# Patient Record
Sex: Male | Born: 1950 | Race: Black or African American | Hispanic: No | Marital: Married | Smoking: Never smoker
Health system: Southern US, Community
[De-identification: ages and names within clinical notes are randomized; demographics above are authoritative.]

## PROBLEM LIST (undated history)

## (undated) ENCOUNTER — Emergency Department (HOSPITAL_BASED_OUTPATIENT_CLINIC_OR_DEPARTMENT_OTHER): Payer: Self-pay | Source: Home / Self Care

## (undated) DIAGNOSIS — E785 Hyperlipidemia, unspecified: Secondary | ICD-10-CM

## (undated) DIAGNOSIS — I509 Heart failure, unspecified: Secondary | ICD-10-CM

## (undated) DIAGNOSIS — I712 Thoracic aortic aneurysm, without rupture, unspecified: Secondary | ICD-10-CM

## (undated) DIAGNOSIS — I2510 Atherosclerotic heart disease of native coronary artery without angina pectoris: Secondary | ICD-10-CM

## (undated) DIAGNOSIS — I440 Atrioventricular block, first degree: Secondary | ICD-10-CM

## (undated) DIAGNOSIS — I10 Essential (primary) hypertension: Secondary | ICD-10-CM

## (undated) DIAGNOSIS — N182 Chronic kidney disease, stage 2 (mild): Secondary | ICD-10-CM

## (undated) DIAGNOSIS — M109 Gout, unspecified: Secondary | ICD-10-CM

## (undated) DIAGNOSIS — G4733 Obstructive sleep apnea (adult) (pediatric): Secondary | ICD-10-CM

## (undated) DIAGNOSIS — I428 Other cardiomyopathies: Secondary | ICD-10-CM

## (undated) DIAGNOSIS — I4510 Unspecified right bundle-branch block: Secondary | ICD-10-CM

## (undated) DIAGNOSIS — N400 Enlarged prostate without lower urinary tract symptoms: Secondary | ICD-10-CM

## (undated) DIAGNOSIS — I371 Nonrheumatic pulmonary valve insufficiency: Secondary | ICD-10-CM

## (undated) DIAGNOSIS — I5020 Unspecified systolic (congestive) heart failure: Secondary | ICD-10-CM

## (undated) DIAGNOSIS — I5022 Chronic systolic (congestive) heart failure: Secondary | ICD-10-CM

## (undated) HISTORY — PX: CORONARY ANGIOPLASTY WITH STENT PLACEMENT: SHX49

---

## 2001-10-13 ENCOUNTER — Ambulatory Visit (HOSPITAL_BASED_OUTPATIENT_CLINIC_OR_DEPARTMENT_OTHER): Admission: RE | Admit: 2001-10-13 | Discharge: 2001-10-13 | Payer: Self-pay | Admitting: Urology

## 2002-03-23 ENCOUNTER — Observation Stay (HOSPITAL_COMMUNITY): Admission: RE | Admit: 2002-03-23 | Discharge: 2002-03-24 | Payer: Self-pay | Admitting: Urology

## 2017-02-28 ENCOUNTER — Emergency Department (HOSPITAL_BASED_OUTPATIENT_CLINIC_OR_DEPARTMENT_OTHER)
Admission: EM | Admit: 2017-02-28 | Discharge: 2017-03-01 | Disposition: A | Discharge status: Home / Self Care | Payer: 59 | Attending: Emergency Medicine | Admitting: Emergency Medicine

## 2017-02-28 ENCOUNTER — Encounter (HOSPITAL_BASED_OUTPATIENT_CLINIC_OR_DEPARTMENT_OTHER): Payer: Self-pay | Admitting: Emergency Medicine

## 2017-02-28 ENCOUNTER — Emergency Department (HOSPITAL_BASED_OUTPATIENT_CLINIC_OR_DEPARTMENT_OTHER): Payer: 59

## 2017-02-28 DIAGNOSIS — N320 Bladder-neck obstruction: Secondary | ICD-10-CM | POA: Diagnosis not present

## 2017-02-28 DIAGNOSIS — I110 Hypertensive heart disease with heart failure: Secondary | ICD-10-CM | POA: Diagnosis not present

## 2017-02-28 DIAGNOSIS — R1031 Right lower quadrant pain: Secondary | ICD-10-CM | POA: Diagnosis present

## 2017-02-28 DIAGNOSIS — N401 Enlarged prostate with lower urinary tract symptoms: Secondary | ICD-10-CM | POA: Insufficient documentation

## 2017-02-28 DIAGNOSIS — R319 Hematuria, unspecified: Secondary | ICD-10-CM | POA: Diagnosis not present

## 2017-02-28 DIAGNOSIS — I509 Heart failure, unspecified: Secondary | ICD-10-CM | POA: Insufficient documentation

## 2017-02-28 DIAGNOSIS — Z79899 Other long term (current) drug therapy: Secondary | ICD-10-CM | POA: Diagnosis not present

## 2017-02-28 DIAGNOSIS — N400 Enlarged prostate without lower urinary tract symptoms: Secondary | ICD-10-CM

## 2017-02-28 DIAGNOSIS — R39198 Other difficulties with micturition: Secondary | ICD-10-CM | POA: Diagnosis not present

## 2017-02-28 HISTORY — DX: Essential (primary) hypertension: I10

## 2017-02-28 HISTORY — DX: Gout, unspecified: M10.9

## 2017-02-28 HISTORY — DX: Heart failure, unspecified: I50.9

## 2017-02-28 LAB — URINALYSIS, ROUTINE W REFLEX MICROSCOPIC
BILIRUBIN URINE: NEGATIVE
Glucose, UA: NEGATIVE mg/dL
HGB URINE DIPSTICK: NEGATIVE
Ketones, ur: NEGATIVE mg/dL
Leukocytes, UA: NEGATIVE
NITRITE: NEGATIVE
PH: 5.5 (ref 5.0–8.0)
Protein, ur: NEGATIVE mg/dL
SPECIFIC GRAVITY, URINE: 1.019 (ref 1.005–1.030)

## 2017-02-28 LAB — CBC WITH DIFFERENTIAL/PLATELET
Basophils Absolute: 0 10*3/uL (ref 0.0–0.1)
Basophils Relative: 0 %
Eosinophils Absolute: 0.1 10*3/uL (ref 0.0–0.7)
Eosinophils Relative: 1 %
HCT: 44.4 % (ref 39.0–52.0)
HEMOGLOBIN: 15.9 g/dL (ref 13.0–17.0)
LYMPHS ABS: 1.2 10*3/uL (ref 0.7–4.0)
LYMPHS PCT: 22 %
MCH: 28.8 pg (ref 26.0–34.0)
MCHC: 35.8 g/dL (ref 30.0–36.0)
MCV: 80.4 fL (ref 78.0–100.0)
MONOS PCT: 13 %
Monocytes Absolute: 0.7 10*3/uL (ref 0.1–1.0)
NEUTROS PCT: 64 %
Neutro Abs: 3.3 10*3/uL (ref 1.7–7.7)
Platelets: 167 10*3/uL (ref 150–400)
RBC: 5.52 MIL/uL (ref 4.22–5.81)
RDW: 16.6 % — ABNORMAL HIGH (ref 11.5–15.5)
WBC: 5.2 10*3/uL (ref 4.0–10.5)

## 2017-02-28 LAB — BASIC METABOLIC PANEL
ANION GAP: 9 (ref 5–15)
BUN: 28 mg/dL — ABNORMAL HIGH (ref 6–20)
CHLORIDE: 102 mmol/L (ref 101–111)
CO2: 23 mmol/L (ref 22–32)
Calcium: 9 mg/dL (ref 8.9–10.3)
Creatinine, Ser: 1.47 mg/dL — ABNORMAL HIGH (ref 0.61–1.24)
GFR calc non Af Amer: 49 mL/min — ABNORMAL LOW (ref >60)
GFR, EST AFRICAN AMERICAN: 56 mL/min — AB (ref >60)
Glucose, Bld: 103 mg/dL — ABNORMAL HIGH (ref 65–99)
POTASSIUM: 4.5 mmol/L (ref 3.5–5.1)
Sodium: 134 mmol/L — ABNORMAL LOW (ref 135–145)

## 2017-02-28 MED ORDER — MORPHINE SULFATE (PF) 4 MG/ML IV SOLN
4.0000 mg | Freq: Once | INTRAVENOUS | Status: AC
  Administered 2017-02-28: 4 mg via INTRAVENOUS
  Filled 2017-02-28: qty 1

## 2017-02-28 MED ORDER — KETOROLAC TROMETHAMINE 30 MG/ML IJ SOLN
15.0000 mg | Freq: Once | INTRAMUSCULAR | Status: AC
  Administered 2017-02-28: 15 mg via INTRAVENOUS
  Filled 2017-02-28: qty 1

## 2017-02-28 MED ORDER — SODIUM CHLORIDE 0.9 % IV BOLUS (SEPSIS)
1000.0000 mL | Freq: Once | INTRAVENOUS | Status: AC
  Administered 2017-02-28: 1000 mL via INTRAVENOUS

## 2017-02-28 NOTE — ED Provider Notes (Signed)
[facility] Provider Note   CSN: [id number] Arrival date & time: 02/28/17  2128  By signing my name below, I, Brandon Cuevas, attest that this documentation has been prepared under the direction and in the presence of [name], Barbara Cower, MD. Electronically Signed: [name], ED Scribe. 02/28/17. 10:20 PM.  History   Chief Complaint Chief Complaint  Patient presents with  . Flank Pain    HPI Brandon Cuevas is a 66 y.o. male with a PMHx of CHF, gout and HTN, who presents to the Emergency Department complaining of gradually worsening, right flank pain for the last 2 days. Pt reports he is trying to pass a kidney stone. His pain radiates to his back and groin. Associated sx include decreased urinary output, urinary odor, and diaphoresis. Pt has been taking aspirin without relief. He had a kidney stone ~ 10-15 years. He had to have it surgically removed. Notes pain is the same. Pt denies hematuria, nausea, vomiting, or any other complaints at this time.   The history is provided by the patient. No language interpreter was used.    Past Medical History:  Diagnosis Date  . CHF (congestive heart failure) (HCC)   . Gout   . Hypertension     There are no active problems to display for this patient.   Past Surgical History:  Procedure Laterality Date  . CORONARY ANGIOPLASTY WITH STENT PLACEMENT         Home Medications    Prior to Admission medications   Medication Sig Start Date End Date Taking? Authorizing Provider  allopurinol (ZYLOPRIM) 300 MG tablet Take 300 mg by mouth daily.   Yes [provider]  carvedilol (COREG) 25 MG tablet Take 25 mg by mouth 2 (two) times daily with a meal.   Yes [provider]  Colchicine (MITIGARE) 0.6 MG CAPS Take by mouth.   Yes [provider]  furosemide (LASIX) 20 MG tablet Take 20 mg by mouth.   Yes [provider]  losartan (COZAAR) 100 MG tablet Take 100 mg by mouth daily.   Yes [provider]  potassium chloride SA (K-DUR,KLOR-CON) 20 MEQ tablet Take 20 mEq by mouth 2 (two) times daily.   Yes [provider]    Family History History reviewed. No pertinent family history.  Social History Social History  Substance Use Topics  . Smoking status: Never Smoker  . Smokeless tobacco: Never Used  . Alcohol use No     Allergies   Sulfa antibiotics   Review of Systems Review of Systems  All other systems reviewed and are negative.  All systems are negative except as noted in the HPI and PMH.   Physical Exam Updated Vital Signs BP (!) 164/116 (BP Location: Left Arm)   Pulse 91   Temp 97.9 F (36.6 C) (Oral)   Resp 19   Ht 5\' 9"  (1.753 m)   Wt 82.1 kg (181 lb)   SpO2 100%   BMI 26.73 kg/m   Physical Exam  Constitutional: He is oriented to person, place, and time. He appears well-developed and well-nourished.  HENT:  Head: Normocephalic.  Eyes: EOM are normal.  Neck: Normal range of motion.  Right sided CVA tenderness.  Cardiovascular: Normal rate, regular rhythm and normal heart sounds.   Pulmonary/Chest: Effort normal.  Abdominal: Soft. He exhibits no distension. There is no tenderness.  Musculoskeletal: Normal range of motion.  Neurological: He is alert and oriented to person, place, and time.  Skin: No rash  noted.  Psychiatric: He has a normal mood and affect.  Nursing note and vitals reviewed.    ED Treatments / Results  DIAGNOSTIC STUDIES: Oxygen Saturation is 100% on RA, normal by my interpretation.   COORDINATION OF CARE: 10:20 PM-Discussed next steps with pt. Pt verbalized understanding and is agreeable with the plan.   Labs (all labs ordered are listed, but only abnormal results are displayed) Labs Reviewed  CBC WITH DIFFERENTIAL/PLATELET - Abnormal; Notable for the following:       Result Value   RDW 16.6 (*)    All other components within normal limits  BASIC METABOLIC PANEL - Abnormal; Notable for the  following:    Sodium 134 (*)    Glucose, Bld 103 (*)    BUN 28 (*)    Creatinine, Ser 1.47 (*)    GFR calc non Af Amer 49 (*)    GFR calc Af Amer 56 (*)    All other components within normal limits  URINALYSIS, ROUTINE W REFLEX MICROSCOPIC    EKG  EKG Interpretation None       Radiology Ct Renal Stone Study  Result Date: 02/28/2017 CLINICAL DATA:  Right flank pain and difficulty urinating. Hematuria. EXAM: CT ABDOMEN AND PELVIS WITHOUT CONTRAST TECHNIQUE: Multidetector CT imaging of the abdomen and pelvis was performed following the standard protocol without IV contrast. COMPARISON:  Abdominal ultrasound 06/11/2013 FINDINGS: Lower chest: Enlarged heart. Calcific atherosclerotic disease of the coronary arteries. Hepatobiliary: No focal liver abnormality is seen. No gallstones, gallbladder wall thickening, or biliary dilatation. Pancreas: Unremarkable. No pancreatic ductal dilatation or surrounding inflammatory changes. Spleen: Normal in size without focal abnormality. Adrenals/Urinary Tract: Normal adrenal glands and right kidney. Hypoattenuated 1.4 cm mass in the anterior cortex of the lower pole of the right kidney likely represents a cyst. No evidence of hydronephrosis or hydroureter. Stomach/Bowel: Stomach is within normal limits. Appendix appears normal. No evidence of bowel wall thickening, distention, or inflammatory changes. Vascular/Lymphatic: Aortic atherosclerosis. No enlarged abdominal or pelvic lymph nodes. Reproductive: Enlarged globular prostate measuring 6.2 x 5.8 Cm. Penile prosthesis with reservoir in the right pelvis. The urinary bladder is markedly distended with superior tip nearing the level of the umbilicus. Other: Small fat containing periumbilical anterior abdominal wall hernia. Musculoskeletal: L5-S1 osteoarthritic changes. IMPRESSION: Probable urinary bladder outlet obstruction with marked distension of the urinary bladder and enlarged globular appearance of the  prostate gland. Please correlate to serum PSA values. Penile prosthesis. No evidence of hydronephrosis, hydroureter or nephrolithiasis. Calcific atherosclerotic disease of the aorta. Enlarged heart with calcific atherosclerotic disease of the coronary arteries. Electronically Signed   By: Ted Mcalpine M.D.   On: 02/28/2017 23:02    Procedures Procedures (including critical care time)  Medications Ordered in ED Medications  sodium chloride 0.9 % bolus 1,000 mL (1,000 mLs Intravenous New Bag/Given 02/28/17 2257)  ketorolac (TORADOL) 30 MG/ML injection 15 mg (15 mg Intravenous Given 02/28/17 2301)  morphine 4 MG/ML injection 4 mg (4 mg Intravenous Given 02/28/17 2355)     Initial Impression / Assessment and Plan / ED Course  I have reviewed the triage vital signs and the nursing notes.  Pertinent labs & imaging results that were available during my care of the patient were reviewed by me and considered in my medical decision making (see chart for details).   Suspect patient's symptoms are related to urinary outlet obstruction from prostatomegaly. Unsure if he has BPH or cancer will follow-up with urology to figure out the same. We'll go  home with Foley. No evidence of significant kidney dysfunction hours creatinine is slightly elevated so we'll need to get that rechecked as well.  Final Clinical Impressions(s) / ED Diagnoses   Final diagnoses:  Bladder outlet obstruction  Enlarged prostate    New Prescriptions New Prescriptions   No medications on file   I personally performed the services described in this documentation, which was scribed in my presence. The recorded information has been reviewed and is accurate.     Azhar Knope, Barbara Cower, MD 03/01/17 [phone]

## 2017-02-28 NOTE — ED Triage Notes (Signed)
Patient states that he is trying to pass a kidney stone. Report that he is having pain to his right back and down into his groin

## 2017-03-01 NOTE — ED Notes (Signed)
Catheter care instructions provided to the pt and the pt verbalized understanding and provided teach back.

## 2017-03-05 ENCOUNTER — Emergency Department (HOSPITAL_BASED_OUTPATIENT_CLINIC_OR_DEPARTMENT_OTHER)
Admission: EM | Admit: 2017-03-05 | Discharge: 2017-03-05 | Disposition: A | Discharge status: Home / Self Care | Payer: 59 | Attending: Emergency Medicine | Admitting: Emergency Medicine

## 2017-03-05 ENCOUNTER — Encounter (HOSPITAL_BASED_OUTPATIENT_CLINIC_OR_DEPARTMENT_OTHER): Payer: Self-pay | Admitting: Emergency Medicine

## 2017-03-05 DIAGNOSIS — Z79899 Other long term (current) drug therapy: Secondary | ICD-10-CM | POA: Diagnosis not present

## 2017-03-05 DIAGNOSIS — Z955 Presence of coronary angioplasty implant and graft: Secondary | ICD-10-CM | POA: Diagnosis not present

## 2017-03-05 DIAGNOSIS — I509 Heart failure, unspecified: Secondary | ICD-10-CM | POA: Insufficient documentation

## 2017-03-05 DIAGNOSIS — T83031A Leakage of indwelling urethral catheter, initial encounter: Secondary | ICD-10-CM | POA: Diagnosis not present

## 2017-03-05 DIAGNOSIS — Y829 Unspecified medical devices associated with adverse incidents: Secondary | ICD-10-CM | POA: Insufficient documentation

## 2017-03-05 DIAGNOSIS — T839XXA Unspecified complication of genitourinary prosthetic device, implant and graft, initial encounter: Secondary | ICD-10-CM

## 2017-03-05 DIAGNOSIS — I10 Essential (primary) hypertension: Secondary | ICD-10-CM | POA: Insufficient documentation

## 2017-03-05 HISTORY — DX: Benign prostatic hyperplasia without lower urinary tract symptoms: N40.0

## 2017-03-05 LAB — BASIC METABOLIC PANEL
Anion gap: 10 (ref 5–15)
BUN: 20 mg/dL (ref 6–20)
CHLORIDE: 104 mmol/L (ref 101–111)
CO2: 19 mmol/L — AB (ref 22–32)
Calcium: 9.2 mg/dL (ref 8.9–10.3)
Creatinine, Ser: 1.25 mg/dL — ABNORMAL HIGH (ref 0.61–1.24)
GFR calc Af Amer: >60 mL/min (ref >60)
GFR, EST NON AFRICAN AMERICAN: 59 mL/min — AB (ref >60)
GLUCOSE: 112 mg/dL — AB (ref 65–99)
Potassium: 4.2 mmol/L (ref 3.5–5.1)
SODIUM: 133 mmol/L — AB (ref 135–145)

## 2017-03-05 LAB — URINALYSIS, ROUTINE W REFLEX MICROSCOPIC
Bilirubin Urine: NEGATIVE
GLUCOSE, UA: NEGATIVE mg/dL
Ketones, ur: NEGATIVE mg/dL
Nitrite: NEGATIVE
PH: 5.5 (ref 5.0–8.0)
PROTEIN: NEGATIVE mg/dL
Specific Gravity, Urine: 1.007 (ref 1.005–1.030)

## 2017-03-05 LAB — URINALYSIS, MICROSCOPIC (REFLEX)

## 2017-03-05 MED ORDER — TRAMADOL HCL 50 MG PO TABS
50.0000 mg | ORAL_TABLET | Freq: Once | ORAL | Status: AC
  Administered 2017-03-05: 50 mg via ORAL
  Filled 2017-03-05: qty 1

## 2017-03-05 MED ORDER — TAMSULOSIN HCL 0.4 MG PO CAPS
0.4000 mg | ORAL_CAPSULE | Freq: Every day | ORAL | Status: DC
  Administered 2017-03-05: 0.4 mg via ORAL
  Filled 2017-03-05: qty 1

## 2017-03-05 NOTE — ED Triage Notes (Signed)
Pt has urinary catheter due to enlarged prostate. Pt states he has been urinating around catheter. No urine is going into catheter bag.

## 2017-03-05 NOTE — ED Provider Notes (Signed)
[facility]   CSN: [id number] Arrival date & time: 03/05/17  2706     History   Chief Complaint Chief Complaint  Patient presents with  . Urine Output    HPI Brandon Cuevas is a 66 y.o. male.  The history is provided by the patient.  Illness  This is a recurrent problem. The current episode started more than 2 days ago. The problem occurs constantly. The problem has not changed since onset.Pertinent negatives include no chest pain, no abdominal pain and no shortness of breath. Nothing aggravates the symptoms. Nothing relieves the symptoms. He has tried nothing for the symptoms. The treatment provided no relief.  Urinating around the foley catheter and no real drainage in the bag since Wednesday then started having spasm and is here for same.    Past Medical History:  Diagnosis Date  . CHF (congestive heart failure) (HCC)   . Enlarged prostate   . Gout   . Hypertension     There are no active problems to display for this patient.   Past Surgical History:  Procedure Laterality Date  . CORONARY ANGIOPLASTY WITH STENT PLACEMENT         Home Medications    Prior to Admission medications   Medication Sig Start Date End Date Taking? Authorizing Provider  allopurinol (ZYLOPRIM) 300 MG tablet Take 300 mg by mouth daily.    [provider]  carvedilol (COREG) 25 MG tablet Take 25 mg by mouth 2 (two) times daily with a meal.    [provider]  Colchicine (MITIGARE) 0.6 MG CAPS Take by mouth.    [provider]  furosemide (LASIX) 20 MG tablet Take 20 mg by mouth.    [provider]  losartan (COZAAR) 100 MG tablet Take 100 mg by mouth daily.    [provider]  potassium chloride SA (K-DUR,KLOR-CON) 20 MEQ tablet Take 20 mEq by mouth 2 (two) times daily.    [provider]    Family History No family history on file.  Social History Social History  Substance Use Topics  . Smoking status:  Never Smoker  . Smokeless tobacco: Never Used  . Alcohol use No     Allergies   Sulfa antibiotics   Review of Systems Review of Systems  Constitutional: Negative for fever.  Respiratory: Negative for shortness of breath.   Cardiovascular: Negative for chest pain.  Gastrointestinal: Negative for abdominal pain.  Genitourinary: Positive for difficulty urinating.  All other systems reviewed and are negative.    Physical Exam Updated Vital Signs BP (!) 149/104   Pulse 82   Temp 97.9 F (36.6 C) (Oral)   Resp 18   SpO2 100%   Physical Exam  Constitutional: He is oriented to person, place, and time. He appears well-developed and well-nourished. No distress.  HENT:  Head: Normocephalic and atraumatic.  Nose: Nose normal.  Mouth/Throat: No oropharyngeal exudate.  Eyes: Pupils are equal, round, and reactive to light. EOM are normal.  Neck: Normal range of motion.  Cardiovascular: Normal rate, regular rhythm, normal heart sounds and intact distal pulses.   Pulmonary/Chest: Effort normal and breath sounds normal. He has no wheezes.  Abdominal: Soft. Bowel sounds are normal. He exhibits no mass. There is no tenderness. There is no rebound and no guarding.  Musculoskeletal: Normal range of motion.  Neurological: He is alert and oriented to person, place, and time.  Skin: Skin is warm and dry. Capillary refill takes less than 2  seconds.  Psychiatric: He has a normal mood and affect.     ED Treatments / Results   Vitals:   03/05/17 0325  BP: (!) 149/104  Pulse: 82  Resp: 18  Temp: 97.9 F (36.6 C)  SpO2: 100%    Labs (all labs ordered are listed, but only abnormal results are displayed) Labs Reviewed  BASIC METABOLIC PANEL - Abnormal; Notable for the following:       Result Value   Sodium 133 (*)    CO2 19 (*)    Glucose, Bld 112 (*)    Creatinine, Ser 1.25 (*)    GFR calc non Af Amer 59 (*)    All other components within normal limits  URINALYSIS, ROUTINE W  REFLEX MICROSCOPIC - Abnormal; Notable for the following:    APPearance CLOUDY (*)    Hgb urine dipstick MODERATE (*)    Leukocytes, UA SMALL (*)    All other components within normal limits  URINALYSIS, MICROSCOPIC (REFLEX) - Abnormal; Notable for the following:    Bacteria, UA FEW (*)    Squamous Epithelial / LPF 0-5 (*)    All other components within normal limits     Procedures Procedures (including critical care time)  Medications Ordered in ED Medications  tamsulosin (FLOMAX) capsule 0.4 mg (0.4 mg Oral Given 03/05/17 0401)  traMADol (ULTRAM) tablet 50 mg (50 mg Oral Given 03/05/17 0400)     Foley replaced and draining well.   Final Clinical Impressions(s) / ED Diagnoses  Continue flomax keep your appointment with your urologist on Monday.     The patient is very well appearing and has been observed in the ED.  Strict return precautions given for  intractable rash, swelling or the lips tongue or floor of the mouth, chest pain, dyspnea on exertion, new weakness or numbness changes in vision or speech,  Inability to tolerate liquids or food, fevers > 101, rashes on the skin, changes in voice cough, altered mental status or any concerns. No signs of systemic illness or infection. The patient is nontoxic-appearing on exam and vital signs are within normal limits.    I have reviewed the triage vital signs and the nursing notes. Pertinent labs &imaging results that were available during my care of the patient were reviewed by me and considered in my medical decision making (see chart for details).  After history, exam, and medical workup I feel the patient has been appropriately medically screened and is safe for discharge home. Pertinent diagnoses were discussed with the patient. Patient was given return precautions.     Sulaiman Imbert, MD 03/05/17 [phone]

## 2017-03-20 ENCOUNTER — Encounter (HOSPITAL_BASED_OUTPATIENT_CLINIC_OR_DEPARTMENT_OTHER): Payer: Self-pay | Admitting: Emergency Medicine

## 2017-03-20 ENCOUNTER — Emergency Department (HOSPITAL_BASED_OUTPATIENT_CLINIC_OR_DEPARTMENT_OTHER): Payer: 59

## 2017-03-20 ENCOUNTER — Observation Stay (HOSPITAL_BASED_OUTPATIENT_CLINIC_OR_DEPARTMENT_OTHER)
Admission: EM | Admit: 2017-03-20 | Discharge: 2017-03-23 | DRG: 292 | Disposition: A | Discharge status: Home / Self Care | Payer: 59 | Attending: Internal Medicine | Admitting: Internal Medicine

## 2017-03-20 DIAGNOSIS — Z7982 Long term (current) use of aspirin: Secondary | ICD-10-CM | POA: Diagnosis not present

## 2017-03-20 DIAGNOSIS — Z955 Presence of coronary angioplasty implant and graft: Secondary | ICD-10-CM

## 2017-03-20 DIAGNOSIS — I509 Heart failure, unspecified: Secondary | ICD-10-CM

## 2017-03-20 DIAGNOSIS — I110 Hypertensive heart disease with heart failure: Principal | ICD-10-CM | POA: Diagnosis present

## 2017-03-20 DIAGNOSIS — R778 Other specified abnormalities of plasma proteins: Secondary | ICD-10-CM | POA: Diagnosis present

## 2017-03-20 DIAGNOSIS — R0602 Shortness of breath: Secondary | ICD-10-CM | POA: Diagnosis present

## 2017-03-20 DIAGNOSIS — I10 Essential (primary) hypertension: Secondary | ICD-10-CM | POA: Diagnosis present

## 2017-03-20 DIAGNOSIS — R079 Chest pain, unspecified: Secondary | ICD-10-CM | POA: Diagnosis present

## 2017-03-20 DIAGNOSIS — I5023 Acute on chronic systolic (congestive) heart failure: Secondary | ICD-10-CM | POA: Diagnosis not present

## 2017-03-20 DIAGNOSIS — R7989 Other specified abnormal findings of blood chemistry: Secondary | ICD-10-CM

## 2017-03-20 DIAGNOSIS — I712 Thoracic aortic aneurysm, without rupture: Secondary | ICD-10-CM | POA: Diagnosis present

## 2017-03-20 DIAGNOSIS — M109 Gout, unspecified: Secondary | ICD-10-CM | POA: Diagnosis present

## 2017-03-20 DIAGNOSIS — Z79899 Other long term (current) drug therapy: Secondary | ICD-10-CM | POA: Diagnosis not present

## 2017-03-20 DIAGNOSIS — I2510 Atherosclerotic heart disease of native coronary artery without angina pectoris: Secondary | ICD-10-CM | POA: Diagnosis not present

## 2017-03-20 DIAGNOSIS — I7121 Aneurysm of the ascending aorta, without rupture: Secondary | ICD-10-CM | POA: Diagnosis present

## 2017-03-20 DIAGNOSIS — Z8249 Family history of ischemic heart disease and other diseases of the circulatory system: Secondary | ICD-10-CM

## 2017-03-20 DIAGNOSIS — I248 Other forms of acute ischemic heart disease: Secondary | ICD-10-CM | POA: Diagnosis present

## 2017-03-20 HISTORY — DX: Unspecified systolic (congestive) heart failure: I50.20

## 2017-03-20 LAB — CBC WITH DIFFERENTIAL/PLATELET
BASOS PCT: 0 %
Basophils Absolute: 0 10*3/uL (ref 0.0–0.1)
EOS PCT: 1 %
Eosinophils Absolute: 0 10*3/uL (ref 0.0–0.7)
HCT: 42.5 % (ref 39.0–52.0)
HEMOGLOBIN: 14.8 g/dL (ref 13.0–17.0)
Lymphocytes Relative: 18 %
Lymphs Abs: 0.8 10*3/uL (ref 0.7–4.0)
MCH: 28.9 pg (ref 26.0–34.0)
MCHC: 34.8 g/dL (ref 30.0–36.0)
MCV: 83 fL (ref 78.0–100.0)
MONO ABS: 0.6 10*3/uL (ref 0.1–1.0)
MONOS PCT: 13 %
NEUTROS PCT: 68 %
Neutro Abs: 3.2 10*3/uL (ref 1.7–7.7)
Platelets: 165 10*3/uL (ref 150–400)
RBC: 5.12 MIL/uL (ref 4.22–5.81)
RDW: 16.7 % — AB (ref 11.5–15.5)
WBC: 4.6 10*3/uL (ref 4.0–10.5)

## 2017-03-20 LAB — COMPREHENSIVE METABOLIC PANEL
ALBUMIN: 3.5 g/dL (ref 3.5–5.0)
ALK PHOS: 39 U/L (ref 38–126)
ALT: 67 U/L — ABNORMAL HIGH (ref 17–63)
ANION GAP: 7 (ref 5–15)
AST: 39 U/L (ref 15–41)
BUN: 23 mg/dL — ABNORMAL HIGH (ref 6–20)
CALCIUM: 9 mg/dL (ref 8.9–10.3)
CHLORIDE: 109 mmol/L (ref 101–111)
CO2: 22 mmol/L (ref 22–32)
Creatinine, Ser: 1.14 mg/dL (ref 0.61–1.24)
GFR calc non Af Amer: >60 mL/min (ref >60)
GLUCOSE: 116 mg/dL — AB (ref 65–99)
POTASSIUM: 4.3 mmol/L (ref 3.5–5.1)
Sodium: 138 mmol/L (ref 135–145)
Total Bilirubin: 1.2 mg/dL (ref 0.3–1.2)
Total Protein: 6.3 g/dL — ABNORMAL LOW (ref 6.5–8.1)

## 2017-03-20 LAB — BRAIN NATRIURETIC PEPTIDE: B NATRIURETIC PEPTIDE 5: 2834.9 pg/mL — AB (ref 0.0–100.0)

## 2017-03-20 LAB — D-DIMER, QUANTITATIVE: D-Dimer, Quant: 0.67 ug/mL-FEU — ABNORMAL HIGH (ref 0.00–0.50)

## 2017-03-20 LAB — TROPONIN I: TROPONIN I: 0.04 ng/mL — AB (ref <0.03)

## 2017-03-20 MED ORDER — IOPAMIDOL (ISOVUE-370) INJECTION 76%
100.0000 mL | Freq: Once | INTRAVENOUS | Status: AC | PRN
  Administered 2017-03-20: 100 mL via INTRAVENOUS

## 2017-03-20 MED ORDER — CARVEDILOL 25 MG PO TABS
25.0000 mg | ORAL_TABLET | Freq: Two times a day (BID) | ORAL | Status: DC
  Filled 2017-03-20: qty 1

## 2017-03-20 MED ORDER — FUROSEMIDE 10 MG/ML IJ SOLN
40.0000 mg | Freq: Once | INTRAMUSCULAR | Status: AC
  Administered 2017-03-20: 40 mg via INTRAVENOUS
  Filled 2017-03-20: qty 4

## 2017-03-20 MED ORDER — HEPARIN (PORCINE) IN NACL 100-0.45 UNIT/ML-% IJ SOLN
1000.0000 [IU]/h | INTRAMUSCULAR | Status: DC
  Administered 2017-03-20: 1000 [IU]/h via INTRAVENOUS
  Filled 2017-03-20: qty 250

## 2017-03-20 MED ORDER — HEPARIN BOLUS VIA INFUSION
4000.0000 [IU] | Freq: Once | INTRAVENOUS | Status: AC
  Administered 2017-03-20: 4000 [IU] via INTRAVENOUS

## 2017-03-20 NOTE — Progress Notes (Signed)
ANTICOAGULATION CONSULT NOTE - Initial Consult  Pharmacy Consult for heparin Indication: chest pain/ACS  Allergies  Allergen Reactions  . Sulfa Antibiotics Rash    Patient Measurements: Height: 5\' 9"  (175.3 cm) Weight: 185 lb (83.9 kg) IBW/kg (Calculated) : 70.7 Heparin Dosing Weight: 83.9kg  Vital Signs: Temp: 97.6 F (36.4 C) (09/08 1513) Temp Source: Oral (09/08 1513) BP: 139/107 (09/08 1601) Pulse Rate: 76 (09/08 1601)  Labs:  Recent Labs  03/20/17 1349  HGB 14.8  HCT 42.5  PLT 165  CREATININE 1.14  TROPONINI 0.04*    Estimated Creatinine Clearance: 65.5 mL/min (by C-G formula based on SCr of 1.14 mg/dL).   Medical History: Past Medical History:  Diagnosis Date  . CHF (congestive heart failure) (HCC)   . Enlarged prostate   . Gout   . Hypertension     Medications:  Infusions:  . heparin      Assessment: 65 yom presented to the ED with SOB. Troponin elevated and now starting IV heparin. CT of chest negative for PE. Baseline CBC is WNL and he is not on anticoagulation PTA.   Goal of Therapy:  Heparin level 0.3-0.7 units/ml Monitor platelets by anticoagulation protocol: Yes   Plan:  Heparin bolus 4000 units IV x 1 Heparin gtt 1000 units/hr Check a 6 hr heparin level Daily heparin level and CBC  Aubrii Sharpless, Drake Leachachel Lynn 03/20/2017,4:14 PM

## 2017-03-20 NOTE — ED Notes (Signed)
CT was notified that MD would like pt to get CTA.  They will come get him.  Pt denies any CP and is in no distress at this time, placed on monitor and cycling BP.

## 2017-03-20 NOTE — ED Notes (Signed)
Troponin 0.04, results given to ED MD

## 2017-03-20 NOTE — ED Triage Notes (Signed)
[facility] . Had CXR and EKG , denies chest pain

## 2017-03-20 NOTE — ED Provider Notes (Signed)
Patient accepted signout from Dr. Corlis LeakMacKuen. Patient had been evaluated for exertional dyspnea and chest pain. Dr. Corlis LeakMacKuen had completed diagnostic evaluation and treatment plan. Pulmonary embolus was ruled out by CT. Findings compatible with CHF with increased BNP and clinical history. Patient had reported some exertional chest pain when very dyspneic and troponin returned borderline elevated at 0.04. Dr.MacKuen opted to initiate ACS heparin protocol as well as Lasix for initial treatment plan. I did receive the call back from hospitalist regarding the patient's admission. Hospitalist requested I proceed with cardiology consultation to determine if it was appropriate to discontinue the patient's heparin as he was not having any active chest pain and this appear more consistent with CHF and less so with acute ischemic event. I did reevaluate patient. He reports that his symptom is exertional dyspnea. He reports he has to really exert himself and get very short of breath before he starts feeling some chest discomfort. He reports as soon as he rests the chest discomfort is gone. He denies any chest pain at this time. He is alert and appropriate. He is having no respiratory distress. I have consulted cardiology reviewed the case with Dr. Virgina OrganQureshi. He advises it would be appropriate to discontinue the heparin as history and findings are most consistent with congestive heart failure and not acute ischemia. Heparin will be discontinued and we'll proceed with admission plan.   Arby BarrettePfeiffer, Jadyn Brasher, MD 03/20/17 [phone]

## 2017-03-20 NOTE — Progress Notes (Signed)
[facility]ue[facility] secondary to SOB over 2 weeks and intermittent chest discomfort. Patient had D-dimer done (which was mildly elevated) and had CT angio (which was neg for PE). Patient had troponin elevated at 0.04, currently CP free and significant elevation of BNP at 2,834.9. Patient accepted for treatment of CHF exacerbation (ho Echo in our electronic records, but CHF listed on his medical history). Patient accepted to telemetry bed.  Vassie LollMadera, Somtochukwu Woollard MD [phone]

## 2017-03-20 NOTE — ED Provider Notes (Signed)
[facility] Provider Note   CSN: [id number] Arrival date & time: 03/20/17  1235     History   Chief Complaint Chief Complaint  Patient presents with  . Shortness of Breath    HPI Ricki Clack is a 66 y.o. male.  HPI   Patient 66 year old male with past medical history of CHF, CAD, stent placement. He is presenting today with increasing shortness of breath the last 2 weeks. Patient reports that he has not no longer able to walk from his car into the house without getting incredibly fatigued and short of breath. Patient went to [facility] walk-in clinic today for these complaints and had an x-ray which showed only mild vascular congestion which would not completely explain his symptoms severity. Therefore he was sent here for further evaluation. Patient reports mild heaviness in the center of his chest epigastric region when walking.  Past Medical History:  Diagnosis Date  . CHF (congestive heart failure) (HCC)   . Enlarged prostate   . Gout   . Hypertension     There are no active problems to display for this patient.   Past Surgical History:  Procedure Laterality Date  . CORONARY ANGIOPLASTY WITH STENT PLACEMENT         Home Medications    Prior to Admission medications   Medication Sig Start Date End Date Taking? Authorizing Provider  allopurinol (ZYLOPRIM) 300 MG tablet Take 300 mg by mouth daily.   Yes [provider]  aspirin EC 81 MG tablet Take 81 mg by mouth daily.   Yes [provider]  carvedilol (COREG) 25 MG tablet Take 25 mg by mouth 2 (two) times daily with a meal.   Yes [provider]  Colchicine (MITIGARE) 0.6 MG CAPS Take by mouth.   Yes [provider]  furosemide (LASIX) 20 MG tablet Take 20 mg by mouth.   Yes [provider]  losartan (COZAAR) 100 MG tablet Take 100 mg by mouth daily.   Yes [provider]  potassium chloride SA (K-DUR,KLOR-CON) 20 MEQ tablet Take 20 mEq by mouth 2  (two) times daily.   Yes [provider]    Family History No family history on file.  Social History Social History  Substance Use Topics  . Smoking status: Never Smoker  . Smokeless tobacco: Never Used  . Alcohol use No     Allergies   Sulfa antibiotics   Review of Systems Review of Systems  Constitutional: Negative for activity change, fatigue and fever.  HENT: Negative for congestion.   Respiratory: Positive for chest tightness and shortness of breath.   Cardiovascular: Positive for chest pain.  Gastrointestinal: Negative for abdominal pain.     Physical Exam Updated Vital Signs BP (!) 139/107   Pulse 76   Temp 97.6 F (36.4 C) (Oral)   Resp 18   Ht  (1.753 m)   Wt 83.9 kg (185 lb)   SpO2 100%   BMI 27.32 kg/m   Physical Exam  Constitutional: He is oriented to person, place, and time. He appears well-nourished.  HENT:  Head: Normocephalic.  Eyes: Conjunctivae and EOM are normal.  Cardiovascular: Normal rate and regular rhythm.   Pulmonary/Chest: Breath sounds normal. No respiratory distress.  Mildl;y labored, mild tachypnea  Abdominal: Soft. He exhibits no distension. There is no tenderness.  Neurological: He is oriented to person, place, and time.  Skin: Skin is warm and dry. He is not diaphoretic.  Psychiatric: He has a normal  mood and affect. His behavior is normal.     ED Treatments / Results  Labs (all labs ordered are listed, but only abnormal results are displayed) Labs Reviewed  CBC WITH DIFFERENTIAL/PLATELET - Abnormal; Notable for the following:       Result Value   RDW 16.7 (*)    All other components within normal limits  COMPREHENSIVE METABOLIC PANEL - Abnormal; Notable for the following:    Glucose, Bld 116 (*)    BUN 23 (*)    Total Protein 6.3 (*)    ALT 67 (*)    All other components within normal limits  D-DIMER, QUANTITATIVE (NOT AT [facility]) - Abnormal; Notable for the following:    D-Dimer, Quant 0.67 (*)     All other components within normal limits  TROPONIN I - Abnormal; Notable for the following:    Troponin I 0.04 (*)    All other components within normal limits  BRAIN NATRIURETIC PEPTIDE - Abnormal; Notable for the following:    B Natriuretic Peptide 2,834.9 (*)    All other components within normal limits    EKG  EKG Interpretation  Date/Time:  Saturday March 20 2017 12:42:07 EDT Ventricular Rate:  77 PR Interval:  188 QRS Duration: 164 QT Interval:  444 QTC Calculation: 502 R Axis:   -101 Text Interpretation:  Sinus rhythm with Premature supraventricular complexes Possible Left atrial enlargement Right bundle branch block , plus right ventricular hypertrophy Abnormal ECG Right bundle branch block Confirmed by Corlis LeakMackuen, Layce Sprung ([id number]) on 03/20/2017 12:54:00 PM       Radiology Ct Angio Chest Pe W And/or Wo Contrast  Result Date: 03/20/2017 CLINICAL DATA:  Chest pain, shortness of breath. EXAM: CT ANGIOGRAPHY CHEST WITH CONTRAST TECHNIQUE: Multidetector CT imaging of the chest was performed using the standard protocol during bolus administration of intravenous contrast. Multiplanar CT image reconstructions and MIPs were obtained to evaluate the vascular anatomy. CONTRAST:  100 mL of Isovue 370 intravenously. COMPARISON:  Radiographs of same day. FINDINGS: Cardiovascular: Satisfactory opacification of the pulmonary arteries to the segmental level. No evidence of pulmonary embolism. Normal heart size. No pericardial effusion. Atherosclerosis of thoracic aorta is noted. 4 cm ascending thoracic aortic aneurysm is noted. Mild cardiomegaly is noted. Coronary artery calcifications are noted. Mediastinum/Nodes: No enlarged mediastinal, hilar, or axillary lymph nodes. Thyroid gland, trachea, and esophagus demonstrate no significant findings. Lungs/Pleura: Mild right pleural effusion is noted. No pneumothorax is noted. No acute pulmonary parenchymal abnormality is noted. Upper Abdomen: No acute  abnormality. Musculoskeletal: No chest wall abnormality. No acute or significant osseous findings. Review of the MIP images confirms the above findings. IMPRESSION: No definite evidence of pulmonary embolus. 4 cm ascending thoracic aortic aneurysm is noted. Recommend annual imaging followup by CTA or MRA. This recommendation follows 2010 ACCF/AHA/AATS/ACR/ASA/SCA/SCAI/SIR/STS/SVM Guidelines for the Diagnosis and Management of Patients with Thoracic Aortic Disease. Circulation. 2010; 121: U045-W098: [id number]. Mild right pleural effusion. Coronary artery calcifications are noted suggesting coronary artery disease. Mild right pleural effusion. Aortic Atherosclerosis (ICD10-I70.0). Electronically Signed   By: Lupita RaiderJames  Green Jr, M.D.   On: 03/20/2017 15:57    Procedures Procedures (including critical care time)  Medications Ordered in ED Medications  iopamidol (ISOVUE-370) 76 % injection 100 mL (100 mLs Intravenous Contrast Given 03/20/17 1531)  furosemide (LASIX) injection 40 mg (40 mg Intravenous Given 03/20/17 1557)     Initial Impression / Assessment and Plan / ED Course  I have reviewed the triage vital signs and the nursing notes.  Pertinent labs &  imaging results that were available during my care of the patient were reviewed by me and considered in my medical decision making (see chart for details).    Patient 66 year old male with past medical history of CHF, CAD, stent placement. He is presenting today with increasing shortness of breath the last 2 weeks. Patient reports that he has not no longer able to walk from his car into the house without getting incredibly fatigued and short of breath. Patient went to [facility] walk-in clinic today for these complaints and had an x-ray which showed only mild vascular congestion which would not completely explain his symptoms severity. Therefore he was sent here for further evaluation. Patient reports mild heaviness in the center of his chest epigastric region when  walking.   4:08 PM Will get labs, troponin, d-dimer. Concern that patient had some type of ischemic event causing worsening EF. Patient's EKG does show new inversions in aVL and V3.  Patient has elevated d dimer, CT angio negative. Mild trop leak with elevated BNP.   Will admit for diuresis, echo and serial troponins.   CRITICAL CARE Performed by: Arlana Hove Total critical care time: 60 minutes Critical care time was exclusive of separately billable procedures and treating other patients. Critical care was necessary to treat or prevent imminent or life-threatening deterioration. Critical care was time spent personally by me on the following activities: development of treatment plan with patient and/or surrogate as well as nursing, discussions with consultants, evaluation of patient's response to treatment, examination of patient, obtaining history from patient or surrogate, ordering and performing treatments and interventions, ordering and review of laboratory studies, ordering and review of radiographic studies, pulse oximetry and re-evaluation of patient's condition.    Final Clinical Impressions(s) / ED Diagnoses   Final diagnoses:  None    New Prescriptions New Prescriptions   No medications on file     Abelino Derrick, MD 03/20/17 [phone]

## 2017-03-20 NOTE — ED Notes (Signed)
Per MD, pt can be treated for BP at accepting hospital.

## 2017-03-21 ENCOUNTER — Encounter (HOSPITAL_COMMUNITY): Payer: Self-pay | Admitting: Internal Medicine

## 2017-03-21 DIAGNOSIS — R079 Chest pain, unspecified: Secondary | ICD-10-CM

## 2017-03-21 DIAGNOSIS — R748 Abnormal levels of other serum enzymes: Secondary | ICD-10-CM | POA: Diagnosis not present

## 2017-03-21 DIAGNOSIS — R778 Other specified abnormalities of plasma proteins: Secondary | ICD-10-CM | POA: Diagnosis present

## 2017-03-21 DIAGNOSIS — I7121 Aneurysm of the ascending aorta, without rupture: Secondary | ICD-10-CM | POA: Diagnosis present

## 2017-03-21 DIAGNOSIS — M109 Gout, unspecified: Secondary | ICD-10-CM | POA: Diagnosis present

## 2017-03-21 DIAGNOSIS — I248 Other forms of acute ischemic heart disease: Secondary | ICD-10-CM | POA: Diagnosis not present

## 2017-03-21 DIAGNOSIS — I110 Hypertensive heart disease with heart failure: Secondary | ICD-10-CM | POA: Diagnosis not present

## 2017-03-21 DIAGNOSIS — I712 Thoracic aortic aneurysm, without rupture: Secondary | ICD-10-CM | POA: Diagnosis not present

## 2017-03-21 DIAGNOSIS — I10 Essential (primary) hypertension: Secondary | ICD-10-CM

## 2017-03-21 DIAGNOSIS — I5023 Acute on chronic systolic (congestive) heart failure: Secondary | ICD-10-CM | POA: Diagnosis not present

## 2017-03-21 DIAGNOSIS — R7989 Other specified abnormal findings of blood chemistry: Secondary | ICD-10-CM

## 2017-03-21 LAB — LIPID PANEL
CHOLESTEROL: 112 mg/dL (ref 0–200)
HDL: 40 mg/dL — ABNORMAL LOW (ref >40)
LDL Cholesterol: 55 mg/dL (ref 0–99)
Total CHOL/HDL Ratio: 2.8 RATIO
Triglycerides: 87 mg/dL (ref <150)
VLDL: 17 mg/dL (ref 0–40)

## 2017-03-21 LAB — TROPONIN I
TROPONIN I: 0.06 ng/mL — AB (ref <0.03)
TROPONIN I: 0.06 ng/mL — AB (ref <0.03)
TROPONIN I: 0.05 ng/mL — AB (ref <0.03)

## 2017-03-21 LAB — RAPID URINE DRUG SCREEN, HOSP PERFORMED
AMPHETAMINES: NOT DETECTED
BARBITURATES: NOT DETECTED
BENZODIAZEPINES: NOT DETECTED
COCAINE: NOT DETECTED
Opiates: NOT DETECTED
Tetrahydrocannabinol: NOT DETECTED

## 2017-03-21 LAB — HIV ANTIBODY (ROUTINE TESTING W REFLEX): HIV Screen 4th Generation wRfx: NONREACTIVE

## 2017-03-21 MED ORDER — SODIUM CHLORIDE 0.9% FLUSH
3.0000 mL | INTRAVENOUS | Status: DC | PRN
  Administered 2017-03-21: 3 mL via INTRAVENOUS
  Filled 2017-03-21: qty 3

## 2017-03-21 MED ORDER — ASPIRIN EC 81 MG PO TBEC
81.0000 mg | DELAYED_RELEASE_TABLET | Freq: Every day | ORAL | Status: DC
Start: 2017-03-21 — End: 2017-03-23
  Administered 2017-03-21 – 2017-03-23 (×3): 81 mg via ORAL
  Filled 2017-03-21 (×3): qty 1

## 2017-03-21 MED ORDER — ACETAMINOPHEN 325 MG PO TABS: 650.0000 mg | ORAL_TABLET | ORAL | Status: DC | PRN

## 2017-03-21 MED ORDER — MORPHINE SULFATE (PF) 2 MG/ML IV SOLN: 2.0000 mg | INTRAVENOUS | Status: DC | PRN

## 2017-03-21 MED ORDER — NITROGLYCERIN 0.4 MG SL SUBL: 0.4000 mg | SUBLINGUAL_TABLET | SUBLINGUAL | Status: DC | PRN

## 2017-03-21 MED ORDER — SODIUM CHLORIDE 0.9 % IV SOLN: 250.0000 mL | INTRAVENOUS | Status: DC | PRN

## 2017-03-21 MED ORDER — COLCHICINE 0.6 MG PO TABS
0.6000 mg | ORAL_TABLET | Freq: Every morning | ORAL | Status: DC
  Administered 2017-03-21 – 2017-03-23 (×3): 0.6 mg via ORAL
  Filled 2017-03-21 (×3): qty 1

## 2017-03-21 MED ORDER — FUROSEMIDE 10 MG/ML IJ SOLN
40.0000 mg | Freq: Two times a day (BID) | INTRAMUSCULAR | Status: DC
  Administered 2017-03-21 – 2017-03-23 (×5): 40 mg via INTRAVENOUS
  Filled 2017-03-21 (×5): qty 4

## 2017-03-21 MED ORDER — SODIUM CHLORIDE 0.9% FLUSH
3.0000 mL | Freq: Two times a day (BID) | INTRAVENOUS | Status: DC
  Administered 2017-03-21 – 2017-03-22 (×4): 3 mL via INTRAVENOUS

## 2017-03-21 MED ORDER — ALLOPURINOL 300 MG PO TABS
300.0000 mg | ORAL_TABLET | Freq: Every day | ORAL | Status: DC
  Administered 2017-03-21 – 2017-03-23 (×3): 300 mg via ORAL
  Filled 2017-03-21 (×3): qty 1

## 2017-03-21 MED ORDER — LOSARTAN POTASSIUM 50 MG PO TABS
100.0000 mg | ORAL_TABLET | Freq: Every day | ORAL | Status: DC
  Administered 2017-03-21 – 2017-03-23 (×3): 100 mg via ORAL
  Filled 2017-03-21 (×3): qty 2

## 2017-03-21 MED ORDER — HYDRALAZINE HCL 20 MG/ML IJ SOLN: 5.0000 mg | INTRAMUSCULAR | Status: DC | PRN

## 2017-03-21 MED ORDER — POTASSIUM CHLORIDE CRYS ER 20 MEQ PO TBCR
20.0000 meq | EXTENDED_RELEASE_TABLET | Freq: Every day | ORAL | Status: DC
  Administered 2017-03-21 – 2017-03-23 (×3): 20 meq via ORAL
  Filled 2017-03-21 (×3): qty 1

## 2017-03-21 MED ORDER — ENOXAPARIN SODIUM 40 MG/0.4ML ~~LOC~~ SOLN
40.0000 mg | SUBCUTANEOUS | Status: DC
  Administered 2017-03-21 – 2017-03-22 (×2): 40 mg via SUBCUTANEOUS
  Filled 2017-03-21 (×2): qty 0.4

## 2017-03-21 MED ORDER — CARVEDILOL 25 MG PO TABS
25.0000 mg | ORAL_TABLET | Freq: Two times a day (BID) | ORAL | Status: DC
  Administered 2017-03-21 – 2017-03-23 (×5): 25 mg via ORAL
  Filled 2017-03-21 (×5): qty 1

## 2017-03-21 MED ORDER — ZOLPIDEM TARTRATE 5 MG PO TABS: 5.0000 mg | ORAL_TABLET | Freq: Every evening | ORAL | Status: DC | PRN

## 2017-03-21 NOTE — Plan of Care (Signed)
Problem: Nutrition: Goal: Adequate nutrition will be maintained Outcome: Not Applicable Date Met: 42/59/56 npo

## 2017-03-21 NOTE — Progress Notes (Signed)
CRITICAL VALUE ALERT  Critical Value:  Troponin 0.06  Date & Time Notied: 03/21/17 0840  Provider Notified: [place]  Orders Received/Actions taken: awaiting response

## 2017-03-21 NOTE — Progress Notes (Signed)
No new orders per Dr. Gwenlyn PerkingMadera. Patient in no acute distress, no complaints of pain. RN will continue to monitor and notify MD if patient becomes symptomatic.

## 2017-03-21 NOTE — H&P (Signed)
History and Physical    Brandon Cuevas ZOX:[id number] DOB: 02/28/1951 DOA: 03/20/2017  Referring MD/NP/PA:   PCP: Daisy Florooss, Charles Alan, MD   Patient coming from:  The patient is coming from home.  At baseline, pt is independent for most of ADL.   Chief Complaint: Shortness of breath, chest pain  HPI: Brandon Cuevas is a 66 y.o. male with medical history significant of CHF with EF<20%, hypertension, BPH, gout, CAD, stent placement, who presents with shortness of breath and chest pain  Patient states that he has been having SOB for 2 weeks, which has been progressively getting worse. He also has intermittent mild chest pain, which is located substernal area, insertional, nonradiating. He has mild dry cough, but no fever or chills. Denies symptoms of UTI, GI symptoms. No unilateral weakness. He speaks in full sentence.  ED Course: pt was found to have BNP 2834.9, troponin 0.04, creatinine 1.14, urinalysis with small amount of leukocyte, positive d-dimer 0.67, temperature normal, no tachycardia, oxygen saturation 94% on room air. CT angiogram did not show definite PE, but showe 4 cm ascending thoracic aortic aneurysm. Patient is admitted to telemetry bed as inpatient.  Review of Systems:   General: no fevers, chills, no body weight gain, has fatigue HEENT: no blurry vision, hearing changes or sore throat Respiratory: has dyspnea, coughing, no wheezing CV: has chest pain, no palpitations GI: no nausea, vomiting, abdominal pain, diarrhea, constipation GU: no dysuria, burning on urination, increased urinary frequency, hematuria  Ext: has leg edema Neuro: no unilateral weakness, numbness, or tingling, no vision change or hearing loss Skin: no rash, no skin tear. MSK: No muscle spasm, no deformity, no limitation of range of movement in spin Heme: No easy bruising.  Travel history: No recent long distant travel.  Allergy:  Allergies  Allergen Reactions  . Sulfa Antibiotics Rash    Past  Medical History:  Diagnosis Date  . CHF (congestive heart failure) (HCC)   . Enlarged prostate   . Gout   . Hypertension   . Systolic congestive heart failure [facility]HCC)     Past Surgical History:  Procedure Laterality Date  . CORONARY ANGIOPLASTY WITH STENT PLACEMENT      Social History:  reports that he has never smoked. He has never used smokeless tobacco. He reports that he does not drink alcohol or use drugs.  Family History:  Family History  Problem Relation Age of Onset  . Hypertension Mother   . Heart disease Brother      Prior to Admission medications   Medication Sig Start Date End Date Taking? Authorizing Provider  allopurinol (ZYLOPRIM) 300 MG tablet Take 300 mg by mouth daily.   Yes [provider]  aspirin EC 81 MG tablet Take 81 mg by mouth daily.   Yes [provider]  carvedilol (COREG) 25 MG tablet Take 25 mg by mouth 2 (two) times daily with a meal.   Yes [provider]  Colchicine (MITIGARE) 0.6 MG CAPS Take by mouth.   Yes [provider]  furosemide (LASIX) 20 MG tablet Take 20 mg by mouth.   Yes [provider]  losartan (COZAAR) 100 MG tablet Take 100 mg by mouth daily.   Yes [provider]  potassium chloride SA (K-DUR,KLOR-CON) 20 MEQ tablet Take 20 mEq by mouth 2 (two) times daily.   Yes [provider]    Physical Exam: Vitals:   03/20/17 2100 03/20/17 2306 03/20/17 2307 03/21/17 0144  BP: (!) 146/100 (!) 145/101 Marland Kitchen(!)  138/98 127/83  Pulse: 73 80  73  Resp: 19 20    Temp:  98.4 F (36.9 C)  97.9 F (36.6 C)  TempSrc:  Oral  Oral  SpO2: 100% 98%  100%  Weight:  82.1 kg (181 lb)    Height:   (1.753 m)     General: Not in acute distress HEENT:       Eyes: PERRL, EOMI, no scleral icterus.       ENT: No discharge from the ears and nose, no pharynx injection, no tonsillar enlargement.        Neck: No JVD, has positive hepatojugular reflux. No bruit, no mass felt. Heme: No neck  lymph node enlargement. Cardiac: S1/S2, RRR, No murmurs, No gallops or rubs. Respiratory: bilaterally. No rales, wheezing, rhonchi or rubs. GI: Soft, nondistended, nontender, no rebound pain, no organomegaly, BS present. GU: No hematuria Ext: trace leg edema bilaterally. 2+DP/PT pulse bilaterally. Musculoskeletal: No joint deformities, No joint redness or warmth, no limitation of ROM in spin. Skin: No rashes.  Neuro: Alert, oriented X3, cranial nerves II-XII grossly intact, moves all extremities normally.  Psych: Patient is not psychotic, no suicidal or hemocidal ideation.  Labs on Admission: I have personally reviewed following labs and imaging studies  CBC:  Recent Labs Lab 03/20/17 1349  WBC 4.6  NEUTROABS 3.2  HGB 14.8  HCT 42.5  MCV 83.0  PLT 165   Basic Metabolic Panel:  Recent Labs Lab 03/20/17 1349  NA 138  K 4.3  CL 109  CO2 22  GLUCOSE 116*  BUN 23*  CREATININE 1.14  CALCIUM 9.0   GFR: Estimated Creatinine Clearance: 65.5 mL/min (by C-G formula based on SCr of 1.14 mg/dL). Liver Function Tests:  Recent Labs Lab 03/20/17 1349  AST 39  ALT 67*  ALKPHOS 39  BILITOT 1.2  PROT 6.3*  ALBUMIN 3.5   No results for input(s): LIPASE, AMYLASE in the last 168 hours. No results for input(s): AMMONIA in the last 168 hours. Coagulation Profile: No results for input(s): INR, PROTIME in the last 168 hours. Cardiac Enzymes:  Recent Labs Lab 03/20/17 1349  TROPONINI 0.04*   BNP (last 3 results) No results for input(s): PROBNP in the last 8760 hours. HbA1C: No results for input(s): HGBA1C in the last 72 hours. CBG: No results for input(s): GLUCAP in the last 168 hours. Lipid Profile: No results for input(s): CHOL, HDL, LDLCALC, TRIG, CHOLHDL, LDLDIRECT in the last 72 hours. Thyroid Function Tests: No results for input(s): TSH, T4TOTAL, FREET4, T3FREE, THYROIDAB in the last 72 hours. Anemia Panel: No results for input(s): VITAMINB12, FOLATE, FERRITIN,  TIBC, IRON, RETICCTPCT in the last 72 hours. Urine analysis:    Component Value Date/Time   COLORURINE YELLOW 03/05/2017 0341   APPEARANCEUR CLOUDY (A) 03/05/2017 0341   LABSPEC 1.007 03/05/2017 0341   PHURINE 5.5 03/05/2017 0341   GLUCOSEU NEGATIVE 03/05/2017 0341   HGBUR MODERATE (A) 03/05/2017 0341   BILIRUBINUR NEGATIVE 03/05/2017 0341   KETONESUR NEGATIVE 03/05/2017 0341   PROTEINUR NEGATIVE 03/05/2017 0341   NITRITE NEGATIVE 03/05/2017 0341   LEUKOCYTESUR SMALL (A) 03/05/2017 0341   Sepsis Labs: (procalcitonin:4,lacticidven:4) )No results found for this or any previous visit (from the past 240 hour(s)).   Radiological Exams on Admission: Ct Angio Chest Pe W And/or Wo Contrast  Result Date: 03/20/2017 CLINICAL DATA:  Chest pain, shortness of breath. EXAM: CT ANGIOGRAPHY CHEST WITH CONTRAST TECHNIQUE: Multidetector CT imaging of the chest was performed using the standard protocol  during bolus administration of intravenous contrast. Multiplanar CT image reconstructions and MIPs were obtained to evaluate the vascular anatomy. CONTRAST:  100 mL of Isovue 370 intravenously. COMPARISON:  Radiographs of same day. FINDINGS: Cardiovascular: Satisfactory opacification of the pulmonary arteries to the segmental level. No evidence of pulmonary embolism. Normal heart size. No pericardial effusion. Atherosclerosis of thoracic aorta is noted. 4 cm ascending thoracic aortic aneurysm is noted. Mild cardiomegaly is noted. Coronary artery calcifications are noted. Mediastinum/Nodes: No enlarged mediastinal, hilar, or axillary lymph nodes. Thyroid gland, trachea, and esophagus demonstrate no significant findings. Lungs/Pleura: Mild right pleural effusion is noted. No pneumothorax is noted. No acute pulmonary parenchymal abnormality is noted. Upper Abdomen: No acute abnormality. Musculoskeletal: No chest wall abnormality. No acute or significant osseous findings. Review of the MIP images confirms the  above findings. IMPRESSION: No definite evidence of pulmonary embolus. 4 cm ascending thoracic aortic aneurysm is noted. Recommend annual imaging followup by CTA or MRA. This recommendation follows 2010 ACCF/AHA/AATS/ACR/ASA/SCA/SCAI/SIR/STS/SVM Guidelines for the Diagnosis and Management of Patients with Thoracic Aortic Disease. Circulation. 2010; 121: Z610-R604. Mild right pleural effusion. Coronary artery calcifications are noted suggesting coronary artery disease. Mild right pleural effusion. Aortic Atherosclerosis (ICD10-I70.0). Electronically Signed   By: Lupita Raider, M.D.   On: 03/20/2017 15:57     EKG: Independently reviewed.  Sinus rhythm, QTC 502, bifascicular block, T-wave inversion in V1-3.     Assessment/Plan Principal Problem:   Acute on chronic systolic CHF (congestive heart failure) (HCC) Active Problems:   Thoracic ascending aortic aneurysm (HCC)   Hypertension   Gout   Chest pain   Elevated troponin   Acute on chronic systolic CHF (congestive heart failure) (HCC): 2-D echo on 07/22/16 showed EF less than 20%. Patient has a shortness rest, elevated BNP 2234, positive hepatojugular reflux, consistent with CHF exacerbation.  -will admit to tele bed as inpt. -Lasix 40 mg bid by IV -trop x 3 -2d echo -will continue home coreg, ASA -Daily weights -strict I/O's -Low salt diet  Hx of CAD, elevated troponin and chest pain: trop 0.04.  Pt has intermittent chest pain recently which is exertional. Patient was initially started with IV heparin, but his chest pain has been free. Cardiology, Dr. Virgina Organ was consulted by EDP, who recommended to discontinue heparin since that elevated troponin is likely due to demand ischemia secondary to CHF exacerbation. - cycle CE q6 x3 and repeat EKG in the am  - Nitroglycerin, Morphine, and aspirin, coreg - Risk factor stratification: will check FLP, UDS and A1C  - f/u 2d echo  HTN:  -Continue Coreg, Cozaar -On IV Lasix -IV  hydralazine when necessary  Gout: stable -continue home allopurinol and Colchicine  Thoracic ascending aortic aneurysm [facility]): CTA incidentally showed 4 cm ascending thoracic aortic aneurysm. - f/u with PCP  DVT ppx: SQ Lovenox Code Status: Full code Family Communication: None at bed side.   Disposition Plan:  Anticipate discharge back to previous home environment Consults called:  Card, Dr. Virgina Organ Admission status:   Inpatient/tele    Date of Service 03/21/2017    Lorretta Harp [facility] Pager [phone]  If 7PM-7AM, please contact night-coverage [email or web address] Password [email or web address] 03/21/2017, 5:39 AM

## 2017-03-21 NOTE — Progress Notes (Signed)
Patient seen and examined. Admitted after midnight secondary to increased shortness of breath, lower extremity swelling and orthopnea. Patient also with mild intermittent chest discomfort with activity. Found to have elevated BNP, interstitial edema/vascular congestion on chest x-ray and mild flat elevation of his troponin. Patient has been admitted for further evaluation and treatment of CHF exacerbation. Currently he is hemodynamically stable, no complaining of chest pain and breathing better.  Please refer to admission note written by Dr. Clyde LundborgNiu on 03/21/17 for further info/details of admission.  Plan: -Continue IV Lasix -Follow daily weights, strict intake and output and low sodium diet -Extensive discussion regarding low-sodium diet and importance of daily weights provided -Will follow electrolytes, renal function and clinical response.  Vassie LollMadera, Aaronjames Kelsay MD [phone]

## 2017-03-21 NOTE — Progress Notes (Signed)
Patient refused bed alarm. Will continue to monitor patient. 

## 2017-03-21 NOTE — Progress Notes (Signed)
Texted Dr Bruna PotterBlount to inform her that [facility] &  BP remains high.  No med ordered @ this time. No acute distress noted. Will continue to monitor patient.

## 2017-03-22 ENCOUNTER — Inpatient Hospital Stay (HOSPITAL_COMMUNITY): Payer: 59

## 2017-03-22 DIAGNOSIS — M109 Gout, unspecified: Secondary | ICD-10-CM

## 2017-03-22 DIAGNOSIS — I5023 Acute on chronic systolic (congestive) heart failure: Secondary | ICD-10-CM

## 2017-03-22 DIAGNOSIS — R079 Chest pain, unspecified: Secondary | ICD-10-CM | POA: Diagnosis not present

## 2017-03-22 DIAGNOSIS — I371 Nonrheumatic pulmonary valve insufficiency: Secondary | ICD-10-CM

## 2017-03-22 DIAGNOSIS — R748 Abnormal levels of other serum enzymes: Secondary | ICD-10-CM

## 2017-03-22 DIAGNOSIS — I10 Essential (primary) hypertension: Secondary | ICD-10-CM | POA: Diagnosis not present

## 2017-03-22 DIAGNOSIS — I712 Thoracic aortic aneurysm, without rupture: Secondary | ICD-10-CM | POA: Diagnosis not present

## 2017-03-22 LAB — BASIC METABOLIC PANEL
ANION GAP: 11 (ref 5–15)
BUN: 24 mg/dL — ABNORMAL HIGH (ref 6–20)
CALCIUM: 9.4 mg/dL (ref 8.9–10.3)
CO2: 20 mmol/L — ABNORMAL LOW (ref 22–32)
Chloride: 104 mmol/L (ref 101–111)
Creatinine, Ser: 1.32 mg/dL — ABNORMAL HIGH (ref 0.61–1.24)
GFR calc non Af Amer: 55 mL/min — ABNORMAL LOW (ref >60)
Glucose, Bld: 93 mg/dL (ref 65–99)
POTASSIUM: 4.3 mmol/L (ref 3.5–5.1)
SODIUM: 135 mmol/L (ref 135–145)

## 2017-03-22 LAB — HEMOGLOBIN A1C
Hgb A1c MFr Bld: 4.5 % — ABNORMAL LOW (ref 4.8–5.6)
Mean Plasma Glucose: 82 mg/dL

## 2017-03-22 LAB — RAPID URINE DRUG SCREEN, HOSP PERFORMED
AMPHETAMINES: NOT DETECTED
BENZODIAZEPINES: NOT DETECTED
Barbiturates: NOT DETECTED
COCAINE: NOT DETECTED
Opiates: NOT DETECTED
TETRAHYDROCANNABINOL: NOT DETECTED

## 2017-03-22 LAB — ECHOCARDIOGRAM COMPLETE
Height: 69 in
Weight: 2804.8 oz

## 2017-03-22 MED ORDER — SPIRONOLACTONE 25 MG PO TABS
12.5000 mg | ORAL_TABLET | Freq: Every day | ORAL | Status: DC
  Administered 2017-03-23: 12.5 mg via ORAL
  Filled 2017-03-22: qty 1

## 2017-03-22 MED ORDER — FUROSEMIDE 40 MG PO TABS
40.0000 mg | ORAL_TABLET | Freq: Every day | ORAL | Status: DC
  Filled 2017-03-22: qty 1

## 2017-03-22 NOTE — Care Management Note (Addendum)
Case Management Note  Patient Details  Name: Brandon Cuevas MRN: [id number] Date of Birth: 05-31-1951  Subjective/Objective:    CHF               Action/Plan: Patient lives at home with his spouse; PCP: Daisy Florooss, Charles Alan, MD; has private insurance with Medicare/ [employer] [facility] [employer] [facility] with prescription drug coverage;  CM following for DCP  Expected Discharge Date:  Possibly 03/24/2017              Expected Discharge Plan:  Home/Self Care  Discharge planning Services  CM Consult  Status of Service:  In process, will continue to follow  [name], [name], RN,MHA,BSN [phone] 03/22/2017, 9:49 AM

## 2017-03-22 NOTE — Progress Notes (Signed)
  Echocardiogram 2D Echocardiogram has been performed.  Pieter PartridgeBrooke S Jinnie Onley 03/22/2017, 10:53 AM

## 2017-03-22 NOTE — Progress Notes (Signed)
[facility] PROGRESS NOTE  Brandon Cuevas ZOX:[id number] DOB: 1951-07-08 DOA: 03/20/2017 PCP: Daisy Florooss, Charles Alan, MD  Interim summary and HPI 66 y.o. male with medical history significant of CHF with EF<20%, hypertension, BPH, gout, CAD, stent placement, who presents with shortness of breath and chest pain.  Assessment/Plan: 1-acute on chronic systolic CHF -patient with 2-D echo showing EF 20% and diffuse hypokinesis  -will follow daily weight and strict I's and O's -responded well to IV lasix -will follow low sodium diet  -patient close to euvolemic state; will transition to adjusted dose of lasix and spironolactone -will continue coreg and losartan -will follow electrolytes and renal function   2-hx of CAD and elevated troponin -very flat elevation due to CHF exacerbation most likely -will discussed with cardiologist for close outpatient follow up -currently CP free and denying SOB -given low EF, might be a candidate for ICD  3-HTN -continue cozaar, coreg and losartan -lasix is also helping with BP control  4-gout: stable -continue allopurinol and colchicine   5-Thoracic ascending aortic aneurysm [facility](HCC): CTA incidentally showed 4 cm ascending thoracic aortic aneurysm. -recommending repeat image study in 1 year for follow up (CTA or MRA)   Code Status: Full Family Communication: no family at bedside  Disposition Plan: hopefully in home soon. Will discussed with cardiology due to abnormal echo results. Patient will benefit of heart failure clinic follow up and most likely will need heart cath in near future if not done during this admission.    Consultants:  Cardiology   Procedures:  2-D echo - Left ventricle: LVEF is severely depressed at approximately 20%   with diffuse hypokinesis. The cavity size was severely dilated.   Wall thickness was increased in a pattern of mild LVH. - Aortic valve: There was trivial regurgitation. - Left atrium: The atrium was  moderately dilated. - Right ventricle: The cavity size was mildly dilated. Systolic   function was moderately to severely reduced. - Right atrium: The atrium was moderately dilated. - Pulmonary arteries: PA peak pressure: 51 mm Hg (S).  Antibiotics:  None   HPI/Subjective: Patient feeling a lot better. No CP and reports significant improvement in his breathing. No nausea, no vomiting, no abd pain.   Objective: Vitals:   03/22/17 0352 03/22/17 1423  BP: (!) 143/98 130/86  Pulse: 79 77  Resp: 20 20  Temp: (!) 97.5 F (36.4 C) 97.8 F (36.6 C)  SpO2:  99%    Intake/Output Summary (Last 24 hours) at 03/22/17 1817 Last data filed at 03/22/17 1755  Gross per 24 hour  Intake              720 ml  Output             2675 ml  Net            -1955 ml   Filed Weights   03/20/17 2306 03/21/17 0635 03/22/17 0352  Weight: 82.1 kg (181 lb) 81.4 kg (179 lb 8 oz) 79.5 kg (175 lb 4.8 oz)    Exam:   General: afebrile, no CP, significant improvement in SOB, no nausea, no vomiting; feeling much better.   Cardiovascular: no rubs, no gallops, no murmurs, no JVD  Respiratory: improved air movement, no wheezing, no frank crackles on exam, normal resp effort  Abdomen: soft, NT, ND, positive BS  Musculoskeletal: no edema, no cyanosis, no clubbing   Data Reviewed: Basic Metabolic Panel:  Recent Labs Lab 03/20/17 1349 03/22/17 0408  NA 138 135  K  4.3 4.3  CL 109 104  CO2 22 20*  GLUCOSE 116* 93  BUN 23* 24*  CREATININE 1.14 1.32*  CALCIUM 9.0 9.4   Liver Function Tests:  Recent Labs Lab 03/20/17 1349  AST 39  ALT 67*  ALKPHOS 39  BILITOT 1.2  PROT 6.3*  ALBUMIN 3.5   CBC:  Recent Labs Lab 03/20/17 1349  WBC 4.6  NEUTROABS 3.2  HGB 14.8  HCT 42.5  MCV 83.0  PLT 165   Cardiac Enzymes:  Recent Labs Lab 03/20/17 1349 03/21/17 0529 03/21/17 1016 03/21/17 1730  TROPONINI 0.04* 0.06* 0.06* 0.05*   BNP (last 3 results)  Recent Labs  03/20/17 1349   BNP 2,834.9*    Studies: No results found.  Scheduled Meds: . allopurinol  300 mg Oral Daily  . aspirin EC  81 mg Oral Daily  . carvedilol  25 mg Oral BID WC  . colchicine  0.6 mg Oral q morning - 10a  . enoxaparin (LOVENOX) injection  40 mg Subcutaneous Q24H  . furosemide  40 mg Intravenous Q12H  . furosemide  40 mg Oral Daily  . losartan  100 mg Oral Daily  . potassium chloride  20 mEq Oral Daily  . sodium chloride flush  3 mL Intravenous Q12H  . [START ON 03/23/2017] spironolactone  12.5 mg Oral Daily   Continuous Infusions: . sodium chloride      Principal Problem:   Acute on chronic systolic CHF (congestive heart failure) (HCC) Active Problems:   Thoracic ascending aortic aneurysm (HCC)   Hypertension   Gout   Chest pain   Elevated troponin    Time spent: 35 minutes    Vassie Loll  [facility] Pager [phone]. If 7PM-7AM, please contact night-coverage at [email or web address], password [facility] 03/22/2017, 6:17 PM  LOS: 2 days

## 2017-03-23 DIAGNOSIS — R079 Chest pain, unspecified: Secondary | ICD-10-CM | POA: Diagnosis not present

## 2017-03-23 DIAGNOSIS — R748 Abnormal levels of other serum enzymes: Secondary | ICD-10-CM | POA: Diagnosis not present

## 2017-03-23 DIAGNOSIS — I5023 Acute on chronic systolic (congestive) heart failure: Secondary | ICD-10-CM | POA: Diagnosis not present

## 2017-03-23 DIAGNOSIS — M109 Gout, unspecified: Secondary | ICD-10-CM | POA: Diagnosis not present

## 2017-03-23 LAB — BASIC METABOLIC PANEL
Anion gap: 10 (ref 5–15)
BUN: 24 mg/dL — AB (ref 6–20)
CHLORIDE: 99 mmol/L — AB (ref 101–111)
CO2: 25 mmol/L (ref 22–32)
CREATININE: 1.38 mg/dL — AB (ref 0.61–1.24)
Calcium: 9.2 mg/dL (ref 8.9–10.3)
GFR, EST NON AFRICAN AMERICAN: 53 mL/min — AB (ref >60)
Glucose, Bld: 84 mg/dL (ref 65–99)
POTASSIUM: 4.1 mmol/L (ref 3.5–5.1)
SODIUM: 134 mmol/L — AB (ref 135–145)

## 2017-03-23 LAB — BRAIN NATRIURETIC PEPTIDE: B NATRIURETIC PEPTIDE 5: 1171.7 pg/mL — AB (ref 0.0–100.0)

## 2017-03-23 MED ORDER — FUROSEMIDE 40 MG PO TABS: 40.0000 mg | ORAL_TABLET | Freq: Every day | ORAL | 1 refills | Status: AC

## 2017-03-23 NOTE — Progress Notes (Signed)
Call placed to C[facility] to notify of telemetry monitoring d/c.   

## 2017-03-23 NOTE — Progress Notes (Signed)
Patient with no complaints or concerns during 7pm - 7am shift. Walked down the hallway before bedtime, tolerated well.  Will continue to monitor.    Garhett Bernhard, RN

## 2017-03-23 NOTE — Discharge Summary (Signed)
Physician Discharge Summary  Brandon Cuevas AVW:[id number] DOB: Jul 30, 1950 DOA: 03/20/2017  PCP: Daisy Floro, MD  Admit date: 03/20/2017 Discharge date: 03/23/2017  Time spent: 35 minutes  Recommendations for Outpatient Follow-up:  Repeat BMET to follow electrolytes and renal function  Reassess volume status and if needed adjust lasix further  Patient needs follow up with cardiology for further adjustment on CHF meds and ICD implantation.  Arrange chest CTA or MRA to follow aortic aneurysm seen incidentally during work up.  Discharge Diagnoses:  Principal Problem:   Acute on chronic systolic CHF (congestive heart failure) (HCC) Active Problems:   Thoracic ascending aortic aneurysm (HCC)   Hypertension   Gout   Chest pain   Elevated troponin   Discharge Condition: stable and improved. Discharge home with instructions to follow up with PCP in 10 days and with cardiology in 2-3 weeks  Diet recommendation: heart healthy/low sodium diet (less than 2 gram sodium daily)  Filed Weights   03/21/17 0635 03/22/17 0352 03/23/17 0353  Weight: 81.4 kg (179 lb 8 oz) 79.5 kg (175 lb 4.8 oz) 79 kg (174 lb 1.6 oz)    History of present illness:  66 y.o.malewith medical history significant of CHF with EF<20%, hypertension, BPH, gout, CAD, stent placement, who presents with shortness of breath and chest pain.  Hospital Course:  1-acute on chronic systolic CHF -patient with 2-D echo showing EF 20% and diffuse hypokinesis  -will recommend daily weight and low sodium diet -responded well to IV lasix; at discharge lasix dose adjusted and spironolactone resume -will also continue losartan and coreg -patient in agreement to follow up with cardiologist and to pursuit ICD placement    2-chest discomfort, hx of CAD and elevated troponin -neg CXR for acute infiltrates and neg CTA for PE -very flat elevation due to demand ischemic from CHF exacerbation  -will need cardiology outpatient follow  up -currently CP free and denying SOB -given low EF will need ICD  3-HTN -continue cozaar, coreg and losartan -lasix also helping with BP control -stable and well controlled -advise to follow heart healthy diet  4-gout: stable -continue allopurinol and colchicine   5-Thoracic ascending aortic aneurysm (HCC):CTA incidentally showed 4 cm ascendingthoracic aortic aneurysm. -recommending repeat image study in 1 year for follow up (CTA or MRA)   Procedures:  2-D echo - Left ventricle: LVEF is severely depressed at approximately 20% with diffuse hypokinesis. The cavity size was severely dilated. Wall thickness was increased in a pattern of mild LVH. - Aortic valve: There was trivial regurgitation. - Left atrium: The atrium was moderately dilated. - Right ventricle: The cavity size was mildly dilated. Systolic function was moderately to severely reduced. - Right atrium: The atrium was moderately dilated. - Pulmonary arteries: PA peak pressure: 51 mm Hg (S).  Consultations:  Cardiology (Dr. Anne Fu curbside; recommended follow up with Dr. Beverely Pace, no ischemic work up as inpatient needed. Also recommended evaluation for ICD implantation)  Discharge Exam: Vitals:   03/23/17 0557 03/23/17 0810  BP: (!) 134/91 (!) 128/92  Pulse: 73 76  Resp:  19  Temp:  98.6 F (37 C)  SpO2:  100%    General: afebrile, no CP, significant improvement in SOB, no nausea, no vomiting; feeling much better and looking to be discharge.   Cardiovascular: no rubs, no gallops, no murmurs, no JVD  Respiratory: improved air movement, no wheezing, no crackles on exam, normal resp effort  Abdomen: soft, NT, ND, positive BS  Musculoskeletal: no edema, no cyanosis, no  clubbing    Discharge Instructions   Discharge Instructions    (HEART FAILURE PATIENTS) Call MD:  Anytime you have any of the following symptoms: 1) 3 pound weight gain in 24 hours or 5 pounds in 1 week 2) shortness of breath,  with or without a dry hacking cough 3) swelling in the hands, feet or stomach 4) if you have to sleep on extra pillows at night in order to breathe.    Complete by:  As directed    Diet - low sodium heart healthy    Complete by:  As directed    Discharge instructions    Complete by:  As directed    Take medications as prescribed  Please arrange follow up with PCP in 10 days Arrange follow up with Dr. Beverely Pace (cardiologist) in 2 weeks Follow low sodium diet  Maintain adequate hydration Check weigh on daily basis (ok to take extra  lasix in the afternoon if you have gained more than 3 pound overnight and/or more than 5 pounds in 1 week). If this happens please contact PCP or cardiologist office.   Increase activity slowly    Complete by:  As directed      Current Discharge Medication List    CONTINUE these medications which have CHANGED   Details  furosemide (LASIX) 40 MG tablet Take 1 tablet (40 mg total) by mouth daily. Qty: 30 tablet, Refills: 1      CONTINUE these medications which have NOT CHANGED   Details  albuterol (PROVENTIL HFA;VENTOLIN HFA) 108 (90 Base) MCG/ACT inhaler Inhale 2 puffs into the lungs every 6 (six) hours as needed for wheezing or shortness of breath.    allopurinol (ZYLOPRIM) 300 MG tablet Take 300 mg by mouth daily.    aspirin EC 81 MG tablet Take 81 mg by mouth daily.    carvedilol (COREG) 25 MG tablet Take 25 mg by mouth 2 (two) times daily with a meal.    Colchicine (MITIGARE) 0.6 MG CAPS Take 0.6 mg by mouth daily as needed (gout attack).     losartan (COZAAR) 100 MG tablet Take 100 mg by mouth daily.    Multiple Vitamin (MULTIVITAMIN WITH MINERALS) TABS tablet Take 1 tablet by mouth daily.    OVER THE COUNTER MEDICATION Take 15 mLs by mouth See admin instructions. Take 15 ml liquid fish [id number] by mouth every morning    potassium chloride SA (K-DUR,KLOR-CON) 20 MEQ tablet Take 20 mEq by mouth daily.     spironolactone (ALDACTONE) 25 MG  tablet Take 25 mg by mouth daily with supper.     tamsulosin (FLOMAX) 0.4 MG CAPS capsule Take 0.4 mg by mouth daily.       Allergies  Allergen Reactions  . Sulfa Antibiotics Rash   Follow-up Information    Daisy Floro, MD. Schedule an appointment as soon as possible for a visit in 10 day(s).   Specialty:  Family Medicine Contact information: [address] [place] [address] [phone]        Merleen Milliner, MD. Schedule an appointment as soon as possible for a visit in 2 week(s).   Specialty:  Cardiology Contact information: [address] [place] [address] [phone]            The results of significant diagnostics from this hospitalization (including imaging, microbiology, ancillary and laboratory) are listed below for reference.    Significant Diagnostic Studies: Ct Angio Chest Pe W And/or Wo Contrast  Result  Date: 03/20/2017 CLINICAL DATA:  Chest pain, shortness of breath. EXAM: CT ANGIOGRAPHY CHEST WITH CONTRAST TECHNIQUE: Multidetector CT imaging of the chest was performed using the standard protocol during bolus administration of intravenous contrast. Multiplanar CT image reconstructions and MIPs were obtained to evaluate the vascular anatomy. CONTRAST:  100 mL of Isovue 370 intravenously. COMPARISON:  Radiographs of same day. FINDINGS: Cardiovascular: Satisfactory opacification of the pulmonary arteries to the segmental level. No evidence of pulmonary embolism. Normal heart size. No pericardial effusion. Atherosclerosis of thoracic aorta is noted. 4 cm ascending thoracic aortic aneurysm is noted. Mild cardiomegaly is noted. Coronary artery calcifications are noted. Mediastinum/Nodes: No enlarged mediastinal, hilar, or axillary lymph nodes. Thyroid gland, trachea, and esophagus demonstrate no significant findings. Lungs/Pleura: Mild right pleural effusion is noted. No pneumothorax is noted. No acute pulmonary parenchymal  abnormality is noted. Upper Abdomen: No acute abnormality. Musculoskeletal: No chest wall abnormality. No acute or significant osseous findings. Review of the MIP images confirms the above findings. IMPRESSION: No definite evidence of pulmonary embolus. 4 cm ascending thoracic aortic aneurysm is noted. Recommend annual imaging followup by CTA or MRA. This recommendation follows 2010 ACCF/AHA/AATS/ACR/ASA/SCA/SCAI/SIR/STS/SVM Guidelines for the Diagnosis and Management of Patients with Thoracic Aortic Disease. Circulation. 2010; 121: A213-Y865: [id number]. Mild right pleural effusion. Coronary artery calcifications are noted suggesting coronary artery disease. Mild right pleural effusion. Aortic Atherosclerosis (ICD10-I70.0). Electronically Signed   By: Lupita RaiderJames  Green Jr, M.D.   On: 03/20/2017 15:57   Ct Renal Stone Study  Result Date: 02/28/2017 CLINICAL DATA:  Right flank pain and difficulty urinating. Hematuria. EXAM: CT ABDOMEN AND PELVIS WITHOUT CONTRAST TECHNIQUE: Multidetector CT imaging of the abdomen and pelvis was performed following the standard protocol without IV contrast. COMPARISON:  Abdominal ultrasound 06/11/2013 FINDINGS: Lower chest: Enlarged heart. Calcific atherosclerotic disease of the coronary arteries. Hepatobiliary: No focal liver abnormality is seen. No gallstones, gallbladder wall thickening, or biliary dilatation. Pancreas: Unremarkable. No pancreatic ductal dilatation or surrounding inflammatory changes. Spleen: Normal in size without focal abnormality. Adrenals/Urinary Tract: Normal adrenal glands and right kidney. Hypoattenuated 1.4 cm mass in the anterior cortex of the lower pole of the right kidney likely represents a cyst. No evidence of hydronephrosis or hydroureter. Stomach/Bowel: Stomach is within normal limits. Appendix appears normal. No evidence of bowel wall thickening, distention, or inflammatory changes. Vascular/Lymphatic: Aortic atherosclerosis. No enlarged abdominal or pelvic lymph  nodes. Reproductive: Enlarged globular prostate measuring 6.2 x 5.8 Cm. Penile prosthesis with reservoir in the right pelvis. The urinary bladder is markedly distended with superior tip nearing the level of the umbilicus. Other: Small fat containing periumbilical anterior abdominal wall hernia. Musculoskeletal: L5-S1 osteoarthritic changes. IMPRESSION: Probable urinary bladder outlet obstruction with marked distension of the urinary bladder and enlarged globular appearance of the prostate gland. Please correlate to serum PSA values. Penile prosthesis. No evidence of hydronephrosis, hydroureter or nephrolithiasis. Calcific atherosclerotic disease of the aorta. Enlarged heart with calcific atherosclerotic disease of the coronary arteries. Electronically Signed   By: Ted Mcalpineobrinka  Dimitrova M.D.   On: 02/28/2017 23:02    Microbiology: No results found for this or any previous visit (from the past 240 hour(s)).   Labs: Basic Metabolic Panel:  Recent Labs Lab 03/20/17 1349 03/22/17 0408 03/23/17 0601  NA 138 135 134*  K 4.3 4.3 4.1  CL 109 104 99*  CO2 22 20* 25  GLUCOSE 116* 93 84  BUN 23* 24* 24*  CREATININE 1.14 1.32* 1.38*  CALCIUM 9.0 9.4 9.2   Liver Function Tests:  Recent Labs  Lab 03/20/17 1349  AST 39  ALT 67*  ALKPHOS 39  BILITOT 1.2  PROT 6.3*  ALBUMIN 3.5   CBC:  Recent Labs Lab 03/20/17 1349  WBC 4.6  NEUTROABS 3.2  HGB 14.8  HCT 42.5  MCV 83.0  PLT 165   Cardiac Enzymes:  Recent Labs Lab 03/20/17 1349 03/21/17 0529 03/21/17 1016 03/21/17 1730  TROPONINI 0.04* 0.06* 0.06* 0.05*   BNP: BNP (last 3 results)  Recent Labs  03/20/17 1349 03/23/17 0601  BNP 2,834.9* 1,171.7*     Signed:  Vassie Loll MD.  [facility] 03/23/2017, 11:07 AM

## 2017-03-23 NOTE — Progress Notes (Signed)
Pt escorted off unit via wheelchair. VSS WNL. Pt c/a/ox4. Wife called to pick pt up at entrance.

## 2017-03-26 ENCOUNTER — Telehealth: Payer: Self-pay | Admitting: *Deleted

## 2017-03-26 NOTE — Telephone Encounter (Signed)
Spoke with Brandon Cuevas about his recent hospitalization for CHF.  Provided patient with information on a Heart Failure Research Study.  Appointment set up for Monday, Sept 17th at 11:00 am to discuss details of HF Research.

## 2017-03-29 ENCOUNTER — Telehealth: Payer: Self-pay | Admitting: *Deleted

## 2017-03-29 NOTE — Telephone Encounter (Signed)
Spoke with Brandon Cuevas about [employer] HF Study.  Brandon Cuevas stated that he is feeling well and wishes to not change anything about his medication.  I informed Brandon Cuevas that the study drug (or placebo) will only supplement his current medications.  I also provided education on his low EF and heart failure.  Brandon Cuevas stated that he would consider being in the study.  Information, including informed consent, will be mailed to the patient.

## 2017-06-17 ENCOUNTER — Emergency Department (HOSPITAL_COMMUNITY)
Admission: EM | Admit: 2017-06-17 | Discharge: 2017-06-17 | Disposition: A | Discharge status: Home / Self Care | Payer: 59 | Attending: Emergency Medicine | Admitting: Emergency Medicine

## 2017-06-17 ENCOUNTER — Other Ambulatory Visit: Payer: Self-pay

## 2017-06-17 ENCOUNTER — Encounter (HOSPITAL_COMMUNITY): Payer: Self-pay | Admitting: Emergency Medicine

## 2017-06-17 DIAGNOSIS — Z7982 Long term (current) use of aspirin: Secondary | ICD-10-CM | POA: Diagnosis not present

## 2017-06-17 DIAGNOSIS — Z79899 Other long term (current) drug therapy: Secondary | ICD-10-CM | POA: Diagnosis not present

## 2017-06-17 DIAGNOSIS — N3091 Cystitis, unspecified with hematuria: Secondary | ICD-10-CM | POA: Diagnosis not present

## 2017-06-17 DIAGNOSIS — Z955 Presence of coronary angioplasty implant and graft: Secondary | ICD-10-CM | POA: Diagnosis not present

## 2017-06-17 DIAGNOSIS — I110 Hypertensive heart disease with heart failure: Secondary | ICD-10-CM | POA: Diagnosis not present

## 2017-06-17 DIAGNOSIS — I5023 Acute on chronic systolic (congestive) heart failure: Secondary | ICD-10-CM | POA: Diagnosis not present

## 2017-06-17 DIAGNOSIS — R319 Hematuria, unspecified: Secondary | ICD-10-CM

## 2017-06-17 DIAGNOSIS — N3090 Cystitis, unspecified without hematuria: Secondary | ICD-10-CM

## 2017-06-17 LAB — URINALYSIS, ROUTINE W REFLEX MICROSCOPIC
Bilirubin Urine: NEGATIVE
GLUCOSE, UA: NEGATIVE mg/dL
Ketones, ur: NEGATIVE mg/dL
NITRITE: NEGATIVE
PH: 5 (ref 5.0–8.0)
PROTEIN: 100 mg/dL — AB
SQUAMOUS EPITHELIAL / LPF: NONE SEEN
Specific Gravity, Urine: 1.018 (ref 1.005–1.030)
Squamous Epithelial / LPF: NONE SEEN

## 2017-06-17 LAB — COMPREHENSIVE METABOLIC PANEL
ALK PHOS: 55 U/L (ref 38–126)
ALT: 20 U/L (ref 17–63)
AST: 28 U/L (ref 15–41)
Albumin: 4.1 g/dL (ref 3.5–5.0)
Anion gap: 8 (ref 5–15)
BILIRUBIN TOTAL: 1 mg/dL (ref 0.3–1.2)
BUN: 21 mg/dL — ABNORMAL HIGH (ref 6–20)
CALCIUM: 9.6 mg/dL (ref 8.9–10.3)
CO2: 23 mmol/L (ref 22–32)
CREATININE: 1.18 mg/dL (ref 0.61–1.24)
Chloride: 103 mmol/L (ref 101–111)
Glucose, Bld: 141 mg/dL — ABNORMAL HIGH (ref 65–99)
Potassium: 3.8 mmol/L (ref 3.5–5.1)
Sodium: 134 mmol/L — ABNORMAL LOW (ref 135–145)
Total Protein: 7.4 g/dL (ref 6.5–8.1)

## 2017-06-17 LAB — CBC
HEMATOCRIT: 41.1 % (ref 39.0–52.0)
HEMOGLOBIN: 14.4 g/dL (ref 13.0–17.0)
MCH: 28.8 pg (ref 26.0–34.0)
MCHC: 35 g/dL (ref 30.0–36.0)
MCV: 82.2 fL (ref 78.0–100.0)
Platelets: 152 10*3/uL (ref 150–400)
RBC: 5 MIL/uL (ref 4.22–5.81)
RDW: 16.2 % — ABNORMAL HIGH (ref 11.5–15.5)
WBC: 10.4 10*3/uL (ref 4.0–10.5)

## 2017-06-17 MED ORDER — CEPHALEXIN 500 MG PO CAPS: 500.0000 mg | ORAL_CAPSULE | Freq: Four times a day (QID) | ORAL | 0 refills | Status: AC

## 2017-06-17 NOTE — ED Notes (Signed)
Pt reports he has been passing blood in his urine for approx 1 month. Pt reports he did not come to the ED because it was blood tinged but then he began to pass blood clots and feel much more discomfort. Pt reports he is continent. Denies fever, chills, or flank pain. Denies penile or scrotal swelling.

## 2017-06-17 NOTE — ED Notes (Signed)
EDP at the bedside.  ?

## 2017-06-17 NOTE — ED Triage Notes (Signed)
Pt presents to ED with penile pain and dark red blood in his urine.  Patient states hx of penile implant with similar pains and blood in the past, but pains normally resolve.  Pt states 1 month of pain this time, with bleeding becoming more frequent.  Hx of kidney stones as well, denies flank or lower abdominal pain.  Pt denies testicular pain.

## 2017-06-17 NOTE — ED Provider Notes (Signed)
[facility] Provider Note   CSN: [id number] Arrival date & time: 06/17/17  1641     History   Chief Complaint Chief Complaint  Patient presents with  . Penis Pain  . Hematuria    HPI Brandon Cuevas is a 66 y.o. male.  HPI 66 year old male with a history of CHF, BPH, HTN and and had a penile implant in 2002 presenting with 2 months of intermittent painless hematuria who for the past 2 weeks has had pain on the penile implant pump in the right scrotum.  Also endorses mild pain along the penile shaft intermittently.  He states that he denies dysuria but has mild pain when he passes blood clots.  Has not had any urinary obstruction and has been urinating appropriately.  Denies any abdominal pain or flank pain.  Denies chest pain, shortness of breath or fevers.  No alleviating or exacerbating factors. Denies penile discharge.  Past Medical History:  Diagnosis Date  . CHF (congestive heart failure) (HCC)   . Enlarged prostate   . Gout   . Hypertension   . Systolic congestive heart failure [facility](HCC)     Patient Active Problem List   Diagnosis Date Noted  . Thoracic ascending aortic aneurysm (HCC) 03/21/2017  . Chest pain 03/21/2017  . Elevated troponin 03/21/2017  . Hypertension   . Gout   . Acute on chronic systolic CHF (congestive heart failure) (HCC) 03/20/2017    Past Surgical History:  Procedure Laterality Date  . CORONARY ANGIOPLASTY WITH STENT PLACEMENT         Home Medications    Prior to Admission medications   Medication Sig Start Date End Date Taking? Authorizing Provider  albuterol (PROVENTIL HFA;VENTOLIN HFA) 108 (90 Base) MCG/ACT inhaler Inhale 2 puffs into the lungs every 6 (six) hours as needed for wheezing or shortness of breath.    [provider]  allopurinol (ZYLOPRIM) 300 MG tablet Take 300 mg by mouth daily.    [provider]  aspirin EC 81 MG tablet Take 81 mg by mouth daily.    [provider]  carvedilol (COREG) 25 MG tablet Take 25 mg by mouth 2 (two) times daily with a meal.    [provider]  cephALEXin (KEFLEX) 500 MG capsule Take 1 capsule (500 mg total) by mouth 4 (four) times daily for 5 days. 06/17/17 06/22/17  Honest Safranek Italyhad, MD  Colchicine (MITIGARE) 0.6 MG CAPS Take 0.6 mg by mouth daily as needed (gout attack).     [provider]  furosemide (LASIX) 40 MG tablet Take 1 tablet (40 mg total) by mouth daily. 03/24/17   Vassie LollMadera, Carlos, MD  losartan (COZAAR) 100 MG tablet Take 100 mg by mouth daily.    [provider]  Multiple Vitamin (MULTIVITAMIN WITH MINERALS) TABS tablet Take 1 tablet by mouth daily.    [provider]  OVER THE COUNTER MEDICATION Take 15 mLs by mouth See admin instructions. Take 15 ml liquid fish [id number] by mouth every morning    [provider]  potassium chloride SA (K-DUR,KLOR-CON) 20 MEQ tablet Take 20 mEq by mouth daily.     [provider]  spironolactone (ALDACTONE) 25 MG tablet Take 25 mg by mouth daily with supper.  02/26/17   [provider]  tamsulosin (FLOMAX) 0.4 MG CAPS capsule Take 0.4 mg by mouth daily. 03/03/17   [provider]    Family History Family History  Problem Relation Age of Onset  .  Hypertension Mother   . Heart disease Brother     Social History Social History   Tobacco Use  . Smoking status: Never Smoker  . Smokeless tobacco: Never Used  Substance Use Topics  . Alcohol use: No  . Drug use: No     Allergies   Sulfa antibiotics   Review of Systems Review of Systems  Constitutional: Negative for chills and fever.  HENT: Negative for ear pain and sore throat.   Eyes: Negative for pain and visual disturbance.  Respiratory: Negative for cough and shortness of breath.   Cardiovascular: Negative for chest pain and palpitations.  Gastrointestinal: Negative for abdominal pain and vomiting.  Genitourinary: Positive for  hematuria and penile pain. Negative for difficulty urinating, discharge, dysuria, flank pain, penile swelling and scrotal swelling.  Musculoskeletal: Negative for arthralgias and back pain.  Skin: Negative for color change and rash.  Neurological: Negative for seizures and syncope.  All other systems reviewed and are negative.    Physical Exam Updated Vital Signs BP (!) 110/57   Pulse (!) 25   Temp 98.3 F (36.8 C) (Oral)   Resp 16   Ht 5\' 9"  (1.753 m)   Wt 79.8 kg (176 lb)   SpO2 97%   BMI 25.99 kg/m   Physical Exam  Constitutional: He appears well-developed and well-nourished.  HENT:  Head: Normocephalic and atraumatic.  Eyes: Conjunctivae are normal.  Neck: Normal range of motion. Neck supple.  Cardiovascular: Normal rate and regular rhythm.  No murmur heard. Pulmonary/Chest: Effort normal and breath sounds normal. No respiratory distress.  Abdominal: Soft. He exhibits no distension. There is no tenderness. There is no guarding, no CVA tenderness, no tenderness at McBurney's point and negative Murphy's sign.  Genitourinary: Testes normal. Penile tenderness (mild TTP a distal end of penile shaft with no overlying erythema or fluctuance.) present. No penile erythema. No discharge found.  Genitourinary Comments: Penile pump in R scrotum is TTP and painful on movement but no scrotal erythema, induration, or fluctuance noted. No testicular pain.  Musculoskeletal: He exhibits no edema.  Neurological: He is alert.  Skin: Skin is warm and dry.  Psychiatric: He has a normal mood and affect.  Nursing note and vitals reviewed.    ED Treatments / Results  Labs (all labs ordered are listed, but only abnormal results are displayed) Labs Reviewed  URINALYSIS, ROUTINE W REFLEX MICROSCOPIC - Abnormal; Notable for the following components:      Result Value   Color, Urine RED (*)    APPearance TURBID (*)    Glucose, UA   (*)    Value: TEST NOT REPORTED DUE TO COLOR INTERFERENCE OF  URINE PIGMENT   Hgb urine dipstick   (*)    Value: TEST NOT REPORTED DUE TO COLOR INTERFERENCE OF URINE PIGMENT   Bilirubin Urine   (*)    Value: TEST NOT REPORTED DUE TO COLOR INTERFERENCE OF URINE PIGMENT   Ketones, ur   (*)    Value: TEST NOT REPORTED DUE TO COLOR INTERFERENCE OF URINE PIGMENT   Protein, ur   (*)    Value: TEST NOT REPORTED DUE TO COLOR INTERFERENCE OF URINE PIGMENT   Nitrite   (*)    Value: TEST NOT REPORTED DUE TO COLOR INTERFERENCE OF URINE PIGMENT   Leukocytes, UA   (*)    Value: TEST NOT REPORTED DUE TO COLOR INTERFERENCE OF URINE PIGMENT   Bacteria, UA MANY (*)    All other components within normal limits  CBC - Abnormal; Notable for the following components:   RDW 16.2 (*)    All other components within normal limits  COMPREHENSIVE METABOLIC PANEL - Abnormal; Notable for the following components:   Sodium 134 (*)    Glucose, Bld 141 (*)    BUN 21 (*)    All other components within normal limits  URINALYSIS, ROUTINE W REFLEX MICROSCOPIC - Abnormal; Notable for the following components:   Color, Urine AMBER (*)    APPearance TURBID (*)    Hgb urine dipstick LARGE (*)    Protein, ur 100 (*)    Leukocytes, UA LARGE (*)    Bacteria, UA RARE (*)    Non Squamous Epithelial 0-5 (*)    All other components within normal limits  URINE CULTURE    EKG  EKG Interpretation None       Radiology No results found.  Procedures Procedures (including critical care time)  Medications Ordered in ED Medications - No data to display   Initial Impression / Assessment and Plan / ED Course  I have reviewed the triage vital signs and the nursing notes.  Pertinent labs & imaging results that were available during my care of the patient were reviewed by me and considered in my medical decision making (see chart for details).     66 year old male with the above history presenting with intermittent hematuria and mild pain around his penile implant and pump.   Denies any recent fevers.  He is afebrile here and is hemodynamically stable.  Abdominal exam benign.  No CVA tenderness.  Tenderness around the penile pump with no overlying erythema, fluctuance or induration.  Testicular exam is normal.  Mild tenderness on the implant at the distal penile shaft.  No discharge noted. CMP grossly unremarkable.  CBC also unremarkable with a hemoglobin of 14.4.  Initial urine from triage showed gross hematuria at the lab was unable to test for nitrites, leukocytes but does show many bacteria.  Concern for possible urinary infection or, less likely, implant infection.  As patient does not have urinary obstruction, no indication for Foley placement.  Patient was able to give another urine sample that was less bloody which was sent for urine culture.  No signs of systemic infection and exam relatively benign, will plan to treat as a urine infection with close urology follow-up.  Patient discussed with the urologist on call, Dr. Alvester MorinBell, who agrees with this plan.  Patient has a scheduled appointment with urology on Monday but will have him call the clinic to see if they want to see him sooner.  Will send home on Keflex with strict return precautions for worsening pain, redness of the scrotum or penis, penile discharge or fevers.  Final Clinical Impressions(s) / ED Diagnoses   Final diagnoses:  Cystitis  Hematuria, unspecified type    ED Discharge Orders        Ordered    cephALEXin (KEFLEX) 500 MG capsule  4 times daily     06/17/17 2256       Javeon Macmurray Italyhad, MD 06/18/17 [id number]    Alvira MondaySchlossman, Erin, MD 06/25/17 1158

## 2017-06-17 NOTE — ED Notes (Signed)
UA reordered and new urine sent. New sample with less blood noted

## 2017-06-17 NOTE — ED Notes (Signed)
Pt given urinal to attempt another urine sample due to other sample being unusable due to color

## 2017-06-20 LAB — URINE CULTURE: Culture: >=100000 — AB

## 2017-06-21 ENCOUNTER — Telehealth: Payer: Self-pay | Admitting: Emergency Medicine

## 2017-06-21 NOTE — Telephone Encounter (Signed)
Post ED Visit - Positive Culture Follow-up: Successful Patient Follow-Up  Culture assessed and recommendations reviewed by: []  Enzo BiNathan Batchelder, Pharm.D. []  Celedonio MiyamotoJeremy Frens, Pharm.D., BCPS AQ-ID []  Garvin FilaMike Maccia, Pharm.D., BCPS []  Georgina PillionElizabeth Martin, [address]Pharm.D., BCPS []  DraytonMinh Pham, Vermont[place]arm.D., BCPS, AAHIVP []  Estella HuskMichelle Turner, Pharm.D., BCPS, AAHIVP []  Lysle Pearlachel Rumbarger, PharmD, BCPS []  Casilda Carlsaylor Stone, PharmD, BCPS []  Pollyann SamplesAndy Johnston, PharmD, BCPS  Positive urine culture  []  Patient discharged without antimicrobial prescription and treatment is now indicated [x]  Organism is resistant to prescribed ED discharge antimicrobial []  Patient with positive blood cultures  Changes discussed with ED provider: Leary RocaMichael Maczis PA New antibiotic prescription stop cephalexin, start ciprofloxacin 500mg  po bid x 7 days [facility] [phone]  Contacted patient, 06/21/17 1402   Berle MullMiller, Kabrina Christiano 06/21/2017, 1:58 PM

## 2017-06-21 NOTE — Progress Notes (Signed)
ED Antimicrobial Stewardship Positive Culture Follow Up  Brandon AngGregory Cuevas is an 66 y.o. male who [facility] with a  chief complaint of   Chief Complaint  Patient presents with  . Penis Pain  . Hematuria   ? Recent Results (from the past 720 hour(s))  Urine culture     Status: Abnormal   Collection Time: 06/17/17  9:26 PM  Result Value Ref Range Status   Specimen Description URINE, RANDOM  Final   Special Requests NONE  Final   Culture >=100,000 COLONIES/mL PSEUDOMONAS AERUGINOSA (A)  Final   Report Status 06/20/2017 FINAL  Final   Organism ID, Bacteria PSEUDOMONAS AERUGINOSA (A)  Final      Susceptibility   Pseudomonas aeruginosa - MIC*    CEFTAZIDIME 2 SENSITIVE Sensitive     CIPROFLOXACIN <=0.25 SENSITIVE Sensitive     GENTAMICIN 4 SENSITIVE Sensitive     IMIPENEM <=0.25 SENSITIVE Sensitive     PIP/TAZO <=4 SENSITIVE Sensitive     CEFEPIME <=1 SENSITIVE Sensitive     * >=100,000 COLONIES/mL PSEUDOMONAS AERUGINOSA   ? Treated with cephalexin , organism resistant to prescribed antimicrobial Patient discharged originally without antimicrobial agent and treatment is now indicated ? New antibiotic prescription: Stop keflex, Begin ciprofloxacin 500 mg BID for 7 days.  ? ED Provider: Leary RocaMichael Maczis PA-C ? Sheron NightingaleJames A Elfa Wooton 06/21/2017, 9:55 AM Infectious Diseases Pharmacist Phone# [phone]

## 2018-03-25 IMAGING — CT CT ANGIO CHEST
2 of 12 series · 15 of 36 positions shown · IV contrast (isovue)
Comparison: Radiographs of same day.

CLINICAL DATA: Chest pain, shortness of breath.

EXAM:
CT ANGIOGRAPHY CHEST WITH CONTRAST
TECHNIQUE: Multidetector CT imaging of the chest was performed using the
standard protocol during bolus administration of intravenous
contrast. Multiplanar CT image reconstructions and MIPs were
obtained to evaluate the vascular anatomy.
CONTRAST:  100 mL of Isovue 370 intravenously.

[Series 9: pe coronal mpr · coronal · 0.66mm/px · 1 of 141 slices shown]
[im 71/141  mediastinal]
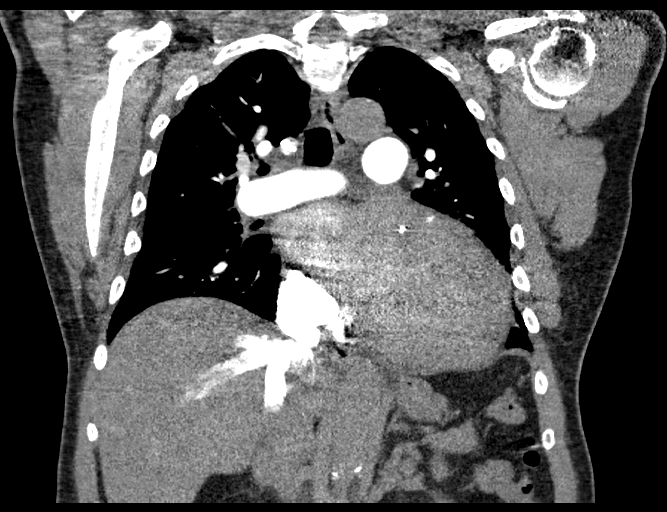

[Series 13: pe thins · axial · 0.87mm/px · z∈[-292,-19]mm · 14 of 315 slices shown]
[im 21/315  lung]
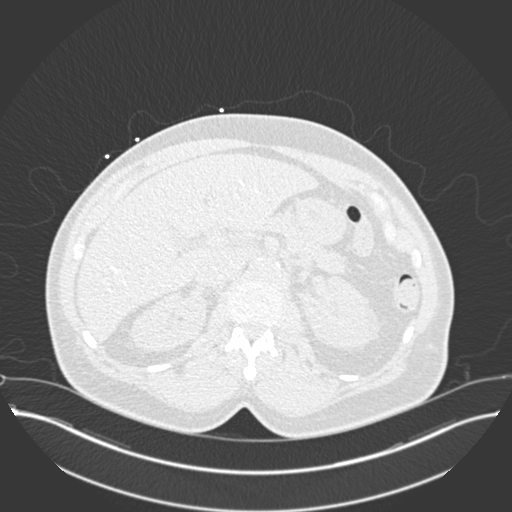
[im 42/315  mediastinal]
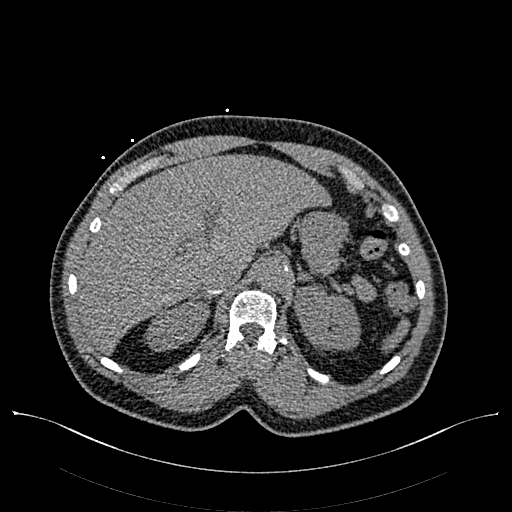
[im 63/315  lung]
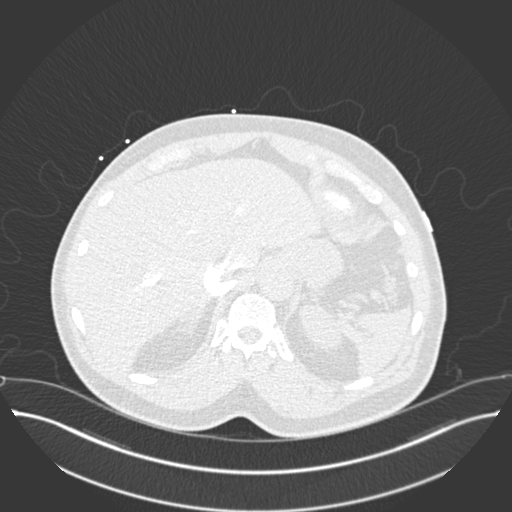
[im 84/315  mediastinal]
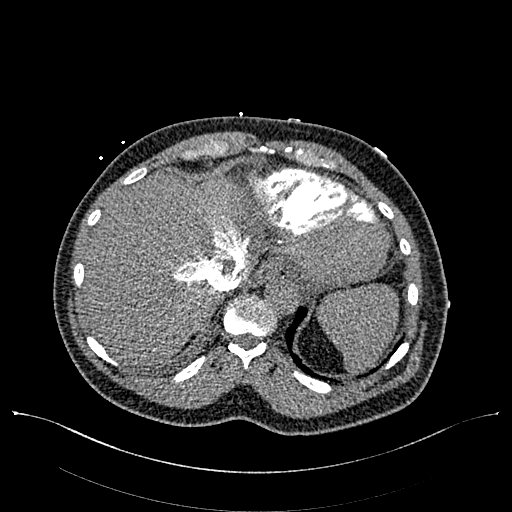
[im 105/315  lung]
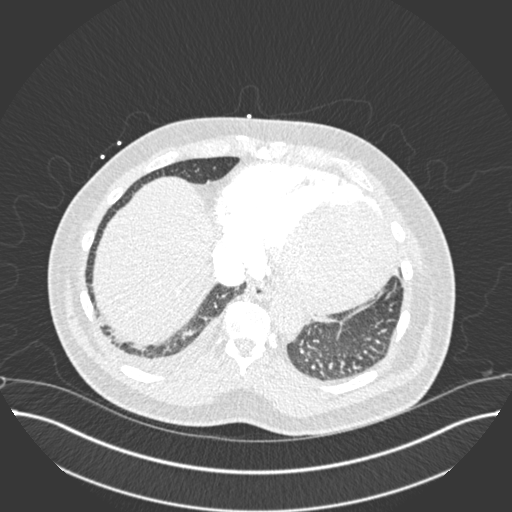
[im 126/315  mediastinal]
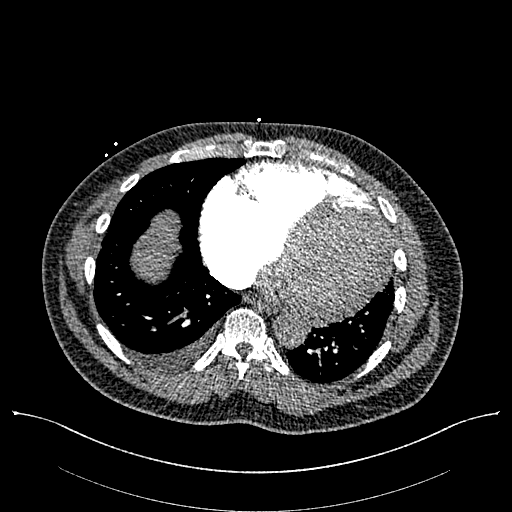
[im 147/315  lung]
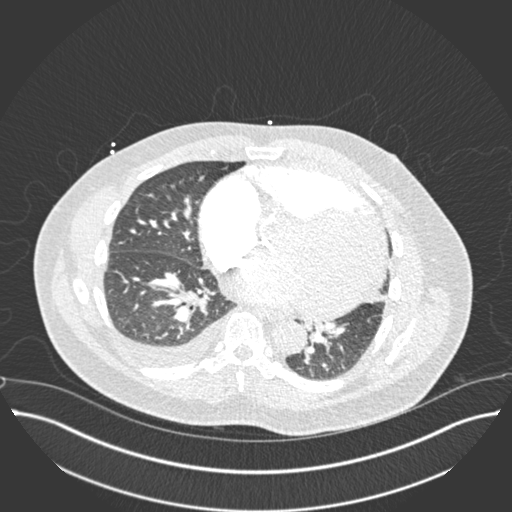
[im 168/315  mediastinal]
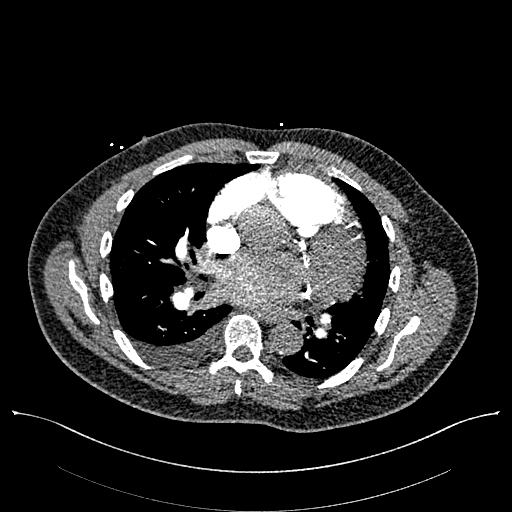
[im 189/315  lung]
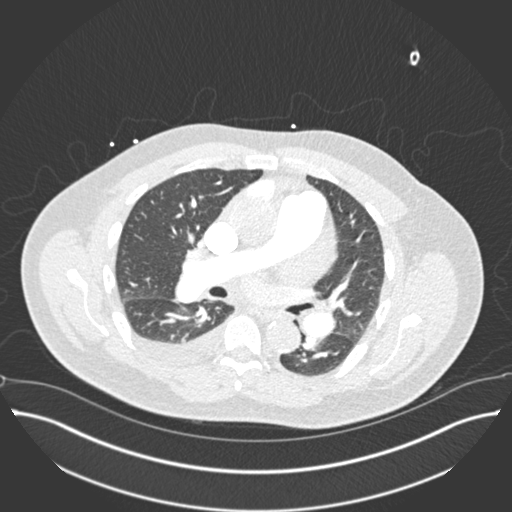
[im 210/315  mediastinal]
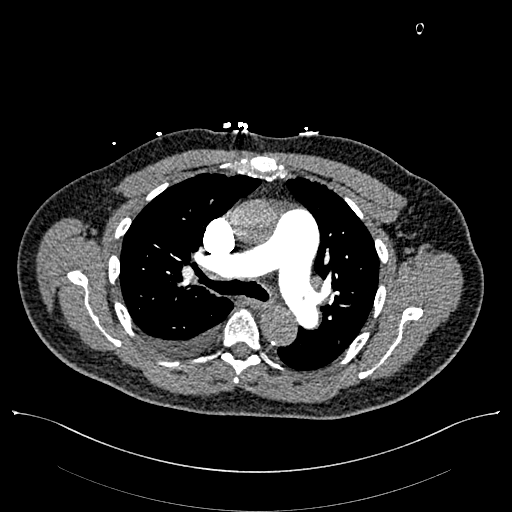
[im 231/315  lung]
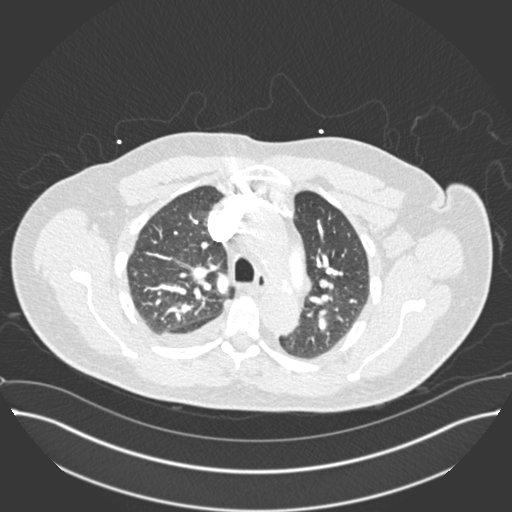
[im 252/315  mediastinal]
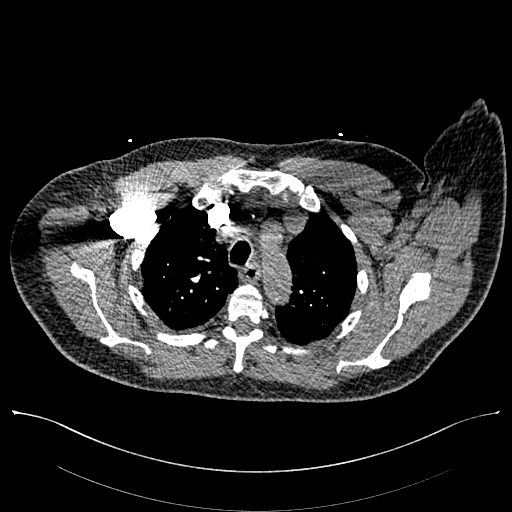
[im 273/315  lung]
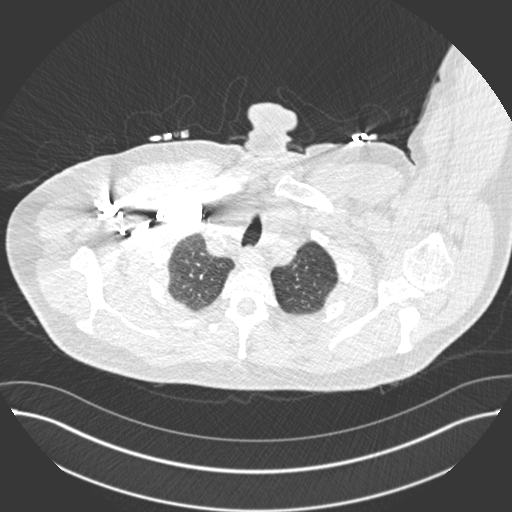
[im 294/315  mediastinal]
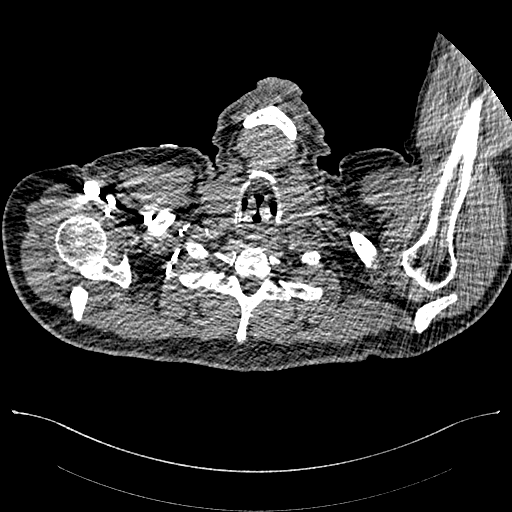

[15 of 36 positions shown; findings below may reference images not displayed]

FINDINGS: Cardiovascular: Satisfactory opacification of the pulmonary arteries
to the segmental level. No evidence of pulmonary embolism. Normal
heart size. No pericardial effusion. Atherosclerosis of thoracic
aorta is noted. 4 cm ascending thoracic aortic aneurysm is noted.
Mild cardiomegaly is noted. Coronary artery calcifications are
noted.

Mediastinum/Nodes: No enlarged mediastinal, hilar, or axillary lymph
nodes. Thyroid gland, trachea, and esophagus demonstrate no
significant findings.

Lungs/Pleura: Mild right pleural effusion is noted. No pneumothorax
is noted. No acute pulmonary parenchymal abnormality is noted.

Upper Abdomen: No acute abnormality.

Musculoskeletal: No chest wall abnormality. No acute or significant
osseous findings.

Review of the MIP images confirms the above findings.
IMPRESSION: No definite evidence of pulmonary embolus.

4 cm ascending thoracic aortic aneurysm is noted. Recommend annual
imaging followup by CTA or MRA. This recommendation follows 7474
ACCF/AHA/AATS/ACR/ASA/SCA/BELEN/PEDRO REIS/MAYO/ADENYO Guidelines for the
Diagnosis and Management of Patients with Thoracic Aortic Disease.
Circulation. 7474; 121: [id number].

Mild right pleural effusion.

Coronary artery calcifications are noted suggesting coronary artery
disease.

Mild right pleural effusion.

Aortic Atherosclerosis ([id number].J).

## 2018-05-10 ENCOUNTER — Other Ambulatory Visit: Payer: Self-pay | Admitting: Family Medicine

## 2018-05-10 DIAGNOSIS — Z136 Encounter for screening for cardiovascular disorders: Secondary | ICD-10-CM

## 2018-05-17 ENCOUNTER — Ambulatory Visit: Payer: 59

## 2019-04-11 ENCOUNTER — Emergency Department (HOSPITAL_BASED_OUTPATIENT_CLINIC_OR_DEPARTMENT_OTHER)
Admission: EM | Admit: 2019-04-11 | Discharge: 2019-04-12 | Disposition: A | Discharge status: Home / Self Care | Payer: Medicare Other | Attending: Emergency Medicine | Admitting: Emergency Medicine

## 2019-04-11 ENCOUNTER — Emergency Department (HOSPITAL_BASED_OUTPATIENT_CLINIC_OR_DEPARTMENT_OTHER): Payer: Medicare Other

## 2019-04-11 ENCOUNTER — Other Ambulatory Visit: Payer: Self-pay

## 2019-04-11 ENCOUNTER — Encounter (HOSPITAL_BASED_OUTPATIENT_CLINIC_OR_DEPARTMENT_OTHER): Payer: Self-pay

## 2019-04-11 DIAGNOSIS — Z7982 Long term (current) use of aspirin: Secondary | ICD-10-CM | POA: Insufficient documentation

## 2019-04-11 DIAGNOSIS — I110 Hypertensive heart disease with heart failure: Secondary | ICD-10-CM | POA: Diagnosis not present

## 2019-04-11 DIAGNOSIS — I5023 Acute on chronic systolic (congestive) heart failure: Secondary | ICD-10-CM | POA: Diagnosis not present

## 2019-04-11 DIAGNOSIS — Z79899 Other long term (current) drug therapy: Secondary | ICD-10-CM | POA: Insufficient documentation

## 2019-04-11 DIAGNOSIS — R0602 Shortness of breath: Secondary | ICD-10-CM | POA: Diagnosis present

## 2019-04-11 LAB — BASIC METABOLIC PANEL
Anion gap: 10 (ref 5–15)
BUN: 19 mg/dL (ref 8–23)
CO2: 23 mmol/L (ref 22–32)
Calcium: 9.6 mg/dL (ref 8.9–10.3)
Chloride: 104 mmol/L (ref 98–111)
Creatinine, Ser: 1.48 mg/dL — ABNORMAL HIGH (ref 0.61–1.24)
GFR calc Af Amer: 56 mL/min — ABNORMAL LOW (ref >60)
GFR calc non Af Amer: 48 mL/min — ABNORMAL LOW (ref >60)
Glucose, Bld: 109 mg/dL — ABNORMAL HIGH (ref 70–99)
Potassium: 4.8 mmol/L (ref 3.5–5.1)
Sodium: 137 mmol/L (ref 135–145)

## 2019-04-11 LAB — CBC WITH DIFFERENTIAL/PLATELET
Abs Immature Granulocytes: 0 10*3/uL (ref 0.00–0.07)
Basophils Absolute: 0 10*3/uL (ref 0.0–0.1)
Basophils Relative: 0 %
Eosinophils Absolute: 0 10*3/uL (ref 0.0–0.5)
Eosinophils Relative: 1 %
HCT: 45.9 % (ref 39.0–52.0)
Hemoglobin: 15.2 g/dL (ref 13.0–17.0)
Immature Granulocytes: 0 %
Lymphocytes Relative: 26 %
Lymphs Abs: 1.4 10*3/uL (ref 0.7–4.0)
MCH: 28.4 pg (ref 26.0–34.0)
MCHC: 33.1 g/dL (ref 30.0–36.0)
MCV: 85.8 fL (ref 80.0–100.0)
Monocytes Absolute: 0.6 10*3/uL (ref 0.1–1.0)
Monocytes Relative: 11 %
Neutro Abs: 3.4 10*3/uL (ref 1.7–7.7)
Neutrophils Relative %: 62 %
Platelets: 174 10*3/uL (ref 150–400)
RBC: 5.35 MIL/uL (ref 4.22–5.81)
RDW: 16.9 % — ABNORMAL HIGH (ref 11.5–15.5)
WBC: 5.4 10*3/uL (ref 4.0–10.5)
nRBC: 0 % (ref 0.0–0.2)

## 2019-04-11 NOTE — ED Notes (Signed)
Pt. Ambulated through the hall. O2 sats remained at 99-100%. Pt stated that he did not feel SOB until he arrived back in his room and sat down. Admitted it was "slightly" better than when he arrived.

## 2019-04-11 NOTE — ED Triage Notes (Signed)
C/o flu like sx x 5 weeks-has not sought medical tx for c/o-NAD-steady gait

## 2019-04-12 LAB — BRAIN NATRIURETIC PEPTIDE: B Natriuretic Peptide: 2062.3 pg/mL — ABNORMAL HIGH (ref 0.0–100.0)

## 2019-04-12 NOTE — ED Provider Notes (Signed)
[place] EMERGENCY DEPARTMENT Provider Note   CSN: [id number] Arrival date & time: 04/11/19  2046     History   Chief Complaint Chief Complaint  Patient presents with   Cough    HPI Brandon Cuevas is a 68 y.o. male.     The history is provided by the patient.  Cough Severity:  Moderate Onset quality:  Gradual Duration:  5 weeks Timing:  Intermittent Progression:  Worsening Smoker: no   Relieved by:  Rest Worsened by:  Activity Associated symptoms: shortness of breath   Associated symptoms: no chest pain, no fever and no sore throat   Patient with history of CHF/CAD presents with cough.  He reports for the past 5 weeks he has intermittent episodes of cough and shortness of breath usually with exertion.  He is noted when he works in his yard that he has to take frequent breaks.  He is able to do most daily activity without problem No chest pain.  No fevers or body aches. He has not had follow-up with his cardiologist yet No orthopnea Past Medical History:  Diagnosis Date   CHF (congestive heart failure) ([place])    Enlarged prostate    Gout    Hypertension    Systolic congestive heart failure [facility])     Patient Active Problem List   Diagnosis Date Noted   Thoracic ascending aortic aneurysm ([place]) 03/21/2017   Chest pain 03/21/2017   Elevated troponin 03/21/2017   Hypertension    Gout    Acute on chronic systolic CHF (congestive heart failure) ([place]) 03/20/2017    Past Surgical History:  Procedure Laterality Date   CORONARY ANGIOPLASTY WITH STENT PLACEMENT          Home Medications    Prior to Admission medications   Medication Sig Start Date End Date Taking? Authorizing Provider  albuterol (PROVENTIL HFA;VENTOLIN HFA) 108 (90 Base) MCG/ACT inhaler Inhale 2 puffs into the lungs every 6 (six) hours as needed for wheezing or shortness of breath.    [provider]  allopurinol (ZYLOPRIM) 300 MG tablet Take 300 mg by mouth  daily.    [provider]  aspirin EC 81 MG tablet Take 81 mg by mouth daily.    [provider]  carvedilol (COREG) 25 MG tablet Take 25 mg by mouth 2 (two) times daily with a meal.    [provider]  Colchicine (MITIGARE) 0.6 MG CAPS Take 0.6 mg by mouth daily as needed (gout attack).     [provider]  furosemide (LASIX) 40 MG tablet Take 1 tablet (40 mg total) by mouth daily. 03/24/17   Barton Dubois, MD  losartan (COZAAR) 100 MG tablet Take 100 mg by mouth daily.    [provider]  Multiple Vitamin (MULTIVITAMIN WITH MINERALS) TABS tablet Take 1 tablet by mouth daily.    [provider]  OVER THE COUNTER MEDICATION Take 15 mLs by mouth See admin instructions. Take 15 ml liquid fish [id number] by mouth every morning    [provider]  potassium chloride SA (K-DUR,KLOR-CON) 20 MEQ tablet Take 20 mEq by mouth daily.     [provider]  spironolactone (ALDACTONE) 25 MG tablet Take 25 mg by mouth daily with supper.  02/26/17   [provider]  tamsulosin (FLOMAX) 0.4 MG CAPS capsule Take 0.4 mg by mouth daily. 03/03/17   [provider]    Family History Family History  Problem Relation Age of Onset   Hypertension  Mother    Heart disease Brother     Social History Social History   Tobacco Use   Smoking status: Never Smoker   Smokeless tobacco: Never Used  Substance Use Topics   Alcohol use: No   Drug use: No     Allergies   Sulfa antibiotics   Review of Systems Review of Systems  Constitutional: Negative for fever.  HENT: Negative for sore throat.   Respiratory: Positive for cough and shortness of breath.   Cardiovascular: Negative for chest pain and leg swelling.  All other systems reviewed and are negative.    Physical Exam Updated Vital Signs BP (!) 139/98 (BP Location: Right Arm)    Pulse 81    Temp 98.2 F (36.8 C) (Oral)    Resp 20    Ht 1.753 m (5\' 9" )    Wt 88 kg     SpO2 99%    BMI 28.65 kg/m   Physical Exam CONSTITUTIONAL: Well developed/well nourished HEAD: Normocephalic/atraumatic EYES: EOMI/PERRL ENMT: Mucous membranes moist NECK: supple no meningeal signs SPINE/BACK:entire spine nontender CV: S1/S2 noted, soft murmur noted LUNGS: Lungs are clear to auscultation bilaterally, no apparent distress ABDOMEN: soft, nontender, no rebound or guarding, bowel sounds noted throughout abdomen GU:no cva tenderness NEURO: Pt is awake/alert/appropriate, moves all extremitiesx4.  No facial droop.   EXTREMITIES: pulses normal/equal, full ROM, no lower extremity or tenderness SKIN: warm, color normal PSYCH: no abnormalities of mood noted, alert and oriented to situation   ED Treatments / Results  Labs (all labs ordered are listed, but only abnormal results are displayed) Labs Reviewed  BASIC METABOLIC PANEL - Abnormal; Notable for the following components:      Result Value   Glucose, Bld 109 (*)    Creatinine, Ser 1.48 (*)    GFR calc non Af Amer 48 (*)    GFR calc Af Amer 56 (*)    All other components within normal limits  CBC WITH DIFFERENTIAL/PLATELET - Abnormal; Notable for the following components:   RDW 16.9 (*)    All other components within normal limits  BRAIN NATRIURETIC PEPTIDE - Abnormal; Notable for the following components:   B Natriuretic Peptide 2,062.3 (*)    All other components within normal limits    EKG EKG Interpretation  Date/Time:  Tuesday April 11 2019 21:20:57 EDT Ventricular Rate:  84 PR Interval:    QRS Duration: 165 QT Interval:  432 QTC Calculation: 511 R Axis:   -91 Text Interpretation:  Sinus rhythm Atrial premature complexes Left atrial enlargement RBBB and LAFB overall similar to previous Confirmed by 12-30-1988 [id number]) on 04/11/2019 9:26:46 PM   Radiology Dg Chest Portable 1 View  Result Date: 04/11/2019 CLINICAL DATA:  68 year old male with increase shortness of breath on exertion. Cough.  EXAM: PORTABLE CHEST 1 VIEW COMPARISON:  Chest radiographs 03/20/2017 and earlier. FINDINGS: Portable AP semi upright view at 2144 hours. Chronic cardiomegaly appears stable. Other mediastinal contours are within normal limits. Visualized tracheal air column is within normal limits. No pneumothorax, pleural effusion, pulmonary edema or confluent pulmonary opacity. Calcified aortic atherosclerosis. No acute osseous abnormality identified. IMPRESSION: 1. Stable cardiomegaly since 2018 with no acute cardiopulmonary abnormality. 2. Aortic Atherosclerosis (ICD10-I70.0). Electronically Signed   By: 2019 M.D.   On: 04/11/2019 22:06    Procedures Procedures   Medications Ordered in ED Medications - No data to display   Initial Impression / Assessment and Plan / ED Course  I have reviewed the triage vital  signs and the nursing notes.  Pertinent labs & imaging results that were available during my care of the patient were reviewed by me and considered in my medical decision making (see chart for details).        Patient presented with cough and shortness of breath he has had for up to 5 weeks.  He reports it is mostly exertional. Given the labs and history, strongly suspicious for mildly worsening CHF. He only takes 20 mg of Lasix daily He reports overall med compliance. Advised we could give patient Lasix here and see if there is improvement.  He was able to ambulate without hypoxia.  He declined further treatment as he now reports he has a follow-up with cardiology APP later in the day.  I advised that he should go on Lasix 40 mg twice daily Low suspicion for ACS/PE at this time. Patient is very well-appearing and appropriate for discharge  Final Clinical Impressions(s) / ED Diagnoses   Final diagnoses:  Acute on chronic systolic congestive heart failure [facility](HCC)    ED Discharge Orders    None       Zadie RhineWickline, Elka Satterfield, MD 04/12/19 [phone]

## 2019-04-12 NOTE — Discharge Instructions (Addendum)
Please take Lasix 40 mg twice a day until you see your cardiology Your labs and history suggest that you have heart failure.  Please follow-up closely with your cardiologist

## 2019-10-30 ENCOUNTER — Emergency Department (HOSPITAL_BASED_OUTPATIENT_CLINIC_OR_DEPARTMENT_OTHER): Payer: Medicare Other

## 2019-10-30 ENCOUNTER — Emergency Department (HOSPITAL_BASED_OUTPATIENT_CLINIC_OR_DEPARTMENT_OTHER)
Admission: EM | Admit: 2019-10-30 | Discharge: 2019-10-30 | Disposition: A | Discharge status: Home / Self Care | Payer: Medicare Other | Attending: Emergency Medicine | Admitting: Emergency Medicine

## 2019-10-30 ENCOUNTER — Encounter (HOSPITAL_BASED_OUTPATIENT_CLINIC_OR_DEPARTMENT_OTHER): Payer: Self-pay | Admitting: Emergency Medicine

## 2019-10-30 ENCOUNTER — Other Ambulatory Visit: Payer: Self-pay

## 2019-10-30 DIAGNOSIS — I5022 Chronic systolic (congestive) heart failure: Secondary | ICD-10-CM | POA: Diagnosis not present

## 2019-10-30 DIAGNOSIS — R2243 Localized swelling, mass and lump, lower limb, bilateral: Secondary | ICD-10-CM | POA: Insufficient documentation

## 2019-10-30 DIAGNOSIS — I110 Hypertensive heart disease with heart failure: Secondary | ICD-10-CM | POA: Diagnosis not present

## 2019-10-30 DIAGNOSIS — R05 Cough: Secondary | ICD-10-CM | POA: Diagnosis not present

## 2019-10-30 DIAGNOSIS — R5383 Other fatigue: Secondary | ICD-10-CM | POA: Diagnosis not present

## 2019-10-30 DIAGNOSIS — R635 Abnormal weight gain: Secondary | ICD-10-CM | POA: Diagnosis not present

## 2019-10-30 DIAGNOSIS — Z7982 Long term (current) use of aspirin: Secondary | ICD-10-CM | POA: Insufficient documentation

## 2019-10-30 DIAGNOSIS — Z79899 Other long term (current) drug therapy: Secondary | ICD-10-CM | POA: Diagnosis not present

## 2019-10-30 DIAGNOSIS — Z955 Presence of coronary angioplasty implant and graft: Secondary | ICD-10-CM | POA: Insufficient documentation

## 2019-10-30 DIAGNOSIS — R0602 Shortness of breath: Secondary | ICD-10-CM | POA: Diagnosis present

## 2019-10-30 DIAGNOSIS — I509 Heart failure, unspecified: Secondary | ICD-10-CM

## 2019-10-30 LAB — POCT I-STAT EG7
Acid-base deficit: 2 mmol/L (ref 0.0–2.0)
Bicarbonate: 21.6 mmol/L (ref 20.0–28.0)
Calcium, Ion: 1.28 mmol/L (ref 1.15–1.40)
HCT: 47 % (ref 39.0–52.0)
Hemoglobin: 16 g/dL (ref 13.0–17.0)
O2 Saturation: 89 %
Patient temperature: 97.4
Potassium: 4 mmol/L (ref 3.5–5.1)
Sodium: 138 mmol/L (ref 135–145)
TCO2: 23 mmol/L (ref 22–32)
pCO2, Ven: 32.5 mmHg — ABNORMAL LOW (ref 44.0–60.0)
pH, Ven: 7.429 (ref 7.250–7.430)
pO2, Ven: 52 mmHg — ABNORMAL HIGH (ref 32.0–45.0)

## 2019-10-30 LAB — CBC WITH DIFFERENTIAL/PLATELET
Abs Immature Granulocytes: 0.01 K/uL (ref 0.00–0.07)
Basophils Absolute: 0 K/uL (ref 0.0–0.1)
Basophils Relative: 0 %
Eosinophils Absolute: 0.1 K/uL (ref 0.0–0.5)
Eosinophils Relative: 1 %
HCT: 44.7 % (ref 39.0–52.0)
Hemoglobin: 15.3 g/dL (ref 13.0–17.0)
Immature Granulocytes: 0 %
Lymphocytes Relative: 12 %
Lymphs Abs: 0.9 K/uL (ref 0.7–4.0)
MCH: 30.1 pg (ref 26.0–34.0)
MCHC: 34.2 g/dL (ref 30.0–36.0)
MCV: 88 fL (ref 80.0–100.0)
Monocytes Absolute: 0.6 K/uL (ref 0.1–1.0)
Monocytes Relative: 8 %
Neutro Abs: 5.6 K/uL (ref 1.7–7.7)
Neutrophils Relative %: 79 %
Platelets: 142 K/uL — ABNORMAL LOW (ref 150–400)
RBC: 5.08 MIL/uL (ref 4.22–5.81)
RDW: 17.4 % — ABNORMAL HIGH (ref 11.5–15.5)
WBC: 7.2 K/uL (ref 4.0–10.5)
nRBC: 0 % (ref 0.0–0.2)

## 2019-10-30 LAB — URINALYSIS, ROUTINE W REFLEX MICROSCOPIC
Bilirubin Urine: NEGATIVE
Glucose, UA: NEGATIVE mg/dL
Hgb urine dipstick: NEGATIVE
Ketones, ur: NEGATIVE mg/dL
Leukocytes,Ua: NEGATIVE
Nitrite: NEGATIVE
Protein, ur: NEGATIVE mg/dL
Specific Gravity, Urine: 1.02 (ref 1.005–1.030)
pH: 6 (ref 5.0–8.0)

## 2019-10-30 LAB — COMPREHENSIVE METABOLIC PANEL
ALT: 33 U/L (ref 0–44)
AST: 31 U/L (ref 15–41)
Albumin: 4 g/dL (ref 3.5–5.0)
Alkaline Phosphatase: 48 U/L (ref 38–126)
Anion gap: 12 (ref 5–15)
BUN: 38 mg/dL — ABNORMAL HIGH (ref 8–23)
CO2: 20 mmol/L — ABNORMAL LOW (ref 22–32)
Calcium: 9.2 mg/dL (ref 8.9–10.3)
Chloride: 104 mmol/L (ref 98–111)
Creatinine, Ser: 1.6 mg/dL — ABNORMAL HIGH (ref 0.61–1.24)
GFR calc Af Amer: 51 mL/min — ABNORMAL LOW (ref >60)
GFR calc non Af Amer: 44 mL/min — ABNORMAL LOW (ref >60)
Glucose, Bld: 106 mg/dL — ABNORMAL HIGH (ref 70–99)
Potassium: 4.3 mmol/L (ref 3.5–5.1)
Sodium: 136 mmol/L (ref 135–145)
Total Bilirubin: 1.7 mg/dL — ABNORMAL HIGH (ref 0.3–1.2)
Total Protein: 7.7 g/dL (ref 6.5–8.1)

## 2019-10-30 LAB — TROPONIN I (HIGH SENSITIVITY)
Troponin I (High Sensitivity): 20 ng/L — ABNORMAL HIGH (ref <18)
Troponin I (High Sensitivity): 23 ng/L — ABNORMAL HIGH (ref <18)

## 2019-10-30 LAB — BRAIN NATRIURETIC PEPTIDE: B Natriuretic Peptide: 4392.3 pg/mL — ABNORMAL HIGH (ref 0.0–100.0)

## 2019-10-30 MED ORDER — FUROSEMIDE 10 MG/ML IJ SOLN
40.0000 mg | Freq: Once | INTRAMUSCULAR | Status: AC
  Administered 2019-10-30: 40 mg via INTRAVENOUS
  Filled 2019-10-30: qty 4

## 2019-10-30 MED ORDER — ALBUTEROL SULFATE HFA 108 (90 BASE) MCG/ACT IN AERS
2.0000 | INHALATION_SPRAY | Freq: Once | RESPIRATORY_TRACT | Status: AC
  Administered 2019-10-30: 2 via RESPIRATORY_TRACT
  Filled 2019-10-30: qty 6.7

## 2019-10-30 NOTE — ED Notes (Signed)
Pt on monitor 

## 2019-10-30 NOTE — ED Provider Notes (Signed)
[facility] EMERGENCY DEPARTMENT Provider Note   CSN: [id number] Arrival date & time: 10/30/19  1601     History Chief Complaint  Patient presents with  . Shortness of Breath    Kruze Atchley is a 69 y.o. male.  HPI    Pt is a 69 y/o male with a h/o CHF, enlarged prostate, gout, hypertension, who presents to the ED today for eval of SOB and fatigue.  States that he has had shortness of breath since he started taking Entresto in September 2020.  This seemed to worsen about a month ago.  He describes worsening orthopnea and dyspnea on exertion.  He also reports some mild bilateral lower extremity swelling and a mild cough.  His wife notes that he has likely gained about 5 pounds recently.  He denies any chest pain, fevers.    His wife states that she feels he has been acting intermittent confused and more fatigued since started on Entresto in sept 2020.  She states he seems "distant "and "agitated ".  The patient states that currently he feels normal and does not feel confused.  Wife states that since stopping his Sherryll Burger he seems somewhat better but not back to baseline.  Past Medical History:  Diagnosis Date  . CHF (congestive heart failure) (HCC)   . Enlarged prostate   . Gout   . Hypertension   . Systolic congestive heart failure [facility])     Patient Active Problem List   Diagnosis Date Noted  . Thoracic ascending aortic aneurysm (HCC) 03/21/2017  . Chest pain 03/21/2017  . Elevated troponin 03/21/2017  . Hypertension   . Gout   . Acute on chronic systolic CHF (congestive heart failure) (HCC) 03/20/2017    Past Surgical History:  Procedure Laterality Date  . CORONARY ANGIOPLASTY WITH STENT PLACEMENT         Family History  Problem Relation Age of Onset  . Hypertension Mother   . Heart disease Brother     Social History   Tobacco Use  . Smoking status: Never Smoker  . Smokeless tobacco: Never Used  Substance Use Topics  . Alcohol use: No  . Drug  use: No    Home Medications Prior to Admission medications   Medication Sig Start Date End Date Taking? Authorizing Provider  albuterol (PROVENTIL HFA;VENTOLIN HFA) 108 (90 Base) MCG/ACT inhaler Inhale 2 puffs into the lungs every 6 (six) hours as needed for wheezing or shortness of breath.    [provider]  allopurinol (ZYLOPRIM) 300 MG tablet Take 300 mg by mouth daily.    [provider]  aspirin EC 81 MG tablet Take 81 mg by mouth daily.    [provider]  carvedilol (COREG) 25 MG tablet Take 25 mg by mouth 2 (two) times daily with a meal.    [provider]  Colchicine (MITIGARE) 0.6 MG CAPS Take 0.6 mg by mouth daily as needed (gout attack).     [provider]  furosemide (LASIX) 40 MG tablet Take 1 tablet (40 mg total) by mouth daily. 03/24/17   Vassie Loll, MD  losartan (COZAAR) 100 MG tablet Take 100 mg by mouth daily.    [provider]  Multiple Vitamin (MULTIVITAMIN WITH MINERALS) TABS tablet Take 1 tablet by mouth daily.    [provider]  OVER THE COUNTER MEDICATION Take 15 mLs by mouth See admin instructions. Take 15 ml liquid fish [id number] by mouth every morning    [provider]  potassium chloride SA (K-DUR,KLOR-CON) 20 MEQ tablet Take 20 mEq by mouth daily.     [provider]  spironolactone (ALDACTONE) 25 MG tablet Take 25 mg by mouth daily with supper.  02/26/17   [provider]  tamsulosin (FLOMAX) 0.4 MG CAPS capsule Take 0.4 mg by mouth daily. 03/03/17   [provider]    Allergies    Sulfa antibiotics  Review of Systems   Review of Systems  Constitutional: Negative for fever.  HENT: Negative for ear pain and sore throat.   Eyes: Negative for pain and visual disturbance.  Respiratory: Positive for cough and shortness of breath.   Cardiovascular: Positive for leg swelling. Negative for chest pain.  Gastrointestinal: Negative for abdominal pain,  constipation, diarrhea, nausea and vomiting.  Genitourinary: Negative for dysuria and hematuria.  Musculoskeletal: Negative for back pain.  Skin: Negative for color change and rash.  Neurological: Negative for headaches.       Seems confused per wife  All other systems reviewed and are negative.   Physical Exam Updated Vital Signs BP (!) 125/96 (BP Location: Right Arm)   Pulse 74   Temp 97.8 F (36.6 C) (Oral)   Resp (!) 28   Ht 5\' 9"  (1.753 m)   Wt 84 kg   SpO2 100%   BMI 27.35 kg/m   Physical Exam Vitals and nursing note reviewed.  Constitutional:      Appearance: He is well-developed.  HENT:     Head: Normocephalic and atraumatic.  Eyes:     Conjunctiva/sclera: Conjunctivae normal.  Cardiovascular:     Rate and Rhythm: Normal rate and regular rhythm.     Heart sounds: Murmur present.  Pulmonary:     Effort: Pulmonary effort is normal. No respiratory distress.     Breath sounds: Decreased breath sounds present.  Abdominal:     General: Bowel sounds are normal.     Palpations: Abdomen is soft.     Tenderness: There is no abdominal tenderness. There is no guarding or rebound.  Musculoskeletal:     Cervical back: Neck supple.     Right lower leg: No tenderness. Edema present.     Left lower leg: No tenderness. Edema present.  Skin:    General: Skin is warm and dry.  Neurological:     Mental Status: He is alert.     ED Results / Procedures / Treatments   Labs (all labs ordered are listed, but only abnormal results are displayed) Labs Reviewed  CBC WITH DIFFERENTIAL/PLATELET - Abnormal; Notable for the following components:      Result Value   RDW 17.4 (*)    Platelets 142 (*)    All other components within normal limits  COMPREHENSIVE METABOLIC PANEL - Abnormal; Notable for the following components:   CO2 20 (*)    Glucose, Bld 106 (*)    BUN 38 (*)    Creatinine, Ser 1.60 (*)    Total Bilirubin 1.7 (*)    GFR calc non Af Amer 44 (*)    GFR calc Af  Amer 51 (*)    All other components within normal limits  BRAIN NATRIURETIC PEPTIDE - Abnormal; Notable for the following components:   B Natriuretic Peptide 4,392.3 (*)    All other components within normal limits  POCT I-STAT EG7 - Abnormal; Notable for the following components:   pCO2, Ven 32.5 (*)    pO2, Ven 52.0 (*)    All other components within normal limits  TROPONIN I (HIGH SENSITIVITY) - Abnormal; Notable for the following components:   Troponin I (High Sensitivity) 20 (*)    All other components within normal limits  TROPONIN I (HIGH SENSITIVITY) - Abnormal; Notable for the following components:   Troponin I (High Sensitivity) 23 (*)    All other components within normal limits  URINALYSIS, ROUTINE W REFLEX MICROSCOPIC  I-STAT VENOUS BLOOD GAS, ED    EKG EKG Interpretation  Date/Time:  Monday October 30 2019 10:19:19 EDT Ventricular Rate:  85 PR Interval:    QRS Duration: 176 QT Interval:  464 QTC Calculation: 464 R Axis:   -102 Text Interpretation: Sinus rhythm Atrial premature complexes in couplets Prolonged PR interval Probable left atrial enlargement RBBB and LAFB No significant change since prior 9/20 Confirmed by Meridee Score [phone]) on 10/30/2019 10:22:25 AM   Radiology DG Chest Portable 1 View  Result Date: 10/30/2019 CLINICAL DATA:  69 year old male with increased shortness of breath and sleep apnea for several days. EXAM: PORTABLE CHEST 1 VIEW COMPARISON:  Portable chest 04/11/2019 and earlier. FINDINGS: Portable AP semi upright view at 1012 hours. Stable moderate cardiomegaly. Other mediastinal contours are within normal limits. Visualized tracheal air column is within normal limits. Stable lung volumes. Allowing for portable technique the lungs are clear. No acute osseous abnormality identified. Paucity of bowel gas in the upper abdomen. IMPRESSION: Stable cardiomegaly. No acute cardiopulmonary abnormality. Electronically Signed   By: Odessa Fleming M.D.   On:  10/30/2019 10:24    Procedures Procedures (including critical care time)  Medications Ordered in ED Medications  albuterol (VENTOLIN HFA) 108 (90 Base) MCG/ACT inhaler 2 puff (2 puffs Inhalation Given 10/30/19 1059)  furosemide (LASIX) injection 40 mg (40 mg Intravenous Given 10/30/19 1133)  furosemide (LASIX) injection 40 mg (40 mg Intravenous Given 10/30/19 1354)    ED Course  I have reviewed the triage vital signs and the nursing notes.  Pertinent labs & imaging results that were available during my care of the patient were reviewed by me and considered in my medical decision making (see chart for details).  Clinical Course as of Oct 30 1503  Mon Oct 30, 2019  3877 69 year old male complaining of a months worth of increased shortness of breath and edema.  Looks comfortable here satting 100%.  Getting labs EKG chest x-ray.  He has an appointment with his cardiologist tomorrow.   [MB]    Clinical Course User Index [MB] Terrilee Files, MD   MDM Rules/Calculators/A&P                      69 year old male with history of CHF (last echo with EF of 20 to 25%) presenting for evaluation of increased orthopnea and dyspnea on exertion ongoing for several months but worsening about a month ago.  States he cannot sleep due to his symptoms which is what prompted him to come to the ED.  He has an appoint with his cardiologist tomorrow.  Wife also thinks that he has been acting intermittently confused and feels that this is secondary to the [place].  We will get labs, EKG, chest x-ray.  CBC w/o leukocytosis, mild thrombocytopenia present CMP with slightly low bicarb, elevated BUN at 38 and elevated Cr at 1.6 which is just slightly worse than  His last labs 6 months ago Troponin with mildly elevated trop at 20, delta trop is stable at 32 BNP elevated above 2300 UA w/o evidence of infection VBG does not show any evidence  of CO2 retention  EKG Sinus rhythm Atrial premature complexes in  couplets Prolonged PR interval Probable left atrial enlargement RBBB and LAFB No significant change since prior 9/20  Chest x-ray reviewed/interpreted, stable cardiomegaly, otherwise reassuring  Discussed possibility of obtaining CT head given wife's report of intermittent confusion.  She and the patient decline imaging at this time as they feel that his symptoms are all related to [facility]. Suspect sxs today are related to an acute heart failure exacerbation.   1:08 PM Discussed case with Phillips Hay, PA-C who is the patients cardiology PA. She reviewed labs. She recommended admission for further diuresis and monitoring of kidney function.   1:26 PM Reassessed pt. He states he is feeling improved. He has had about 400-500cc urine output. He continues to have normal O2 sats. We discussed admission to the hospital for further diuresis however he does not want to be admitted. He would prefer to f/u tomorrow for his appt.   1:45 PM Discussed case with Kendell Bane, PA-C about patients wishes to be discharged. She recommended giving an additional 20-40mg  IV lasix for further diuresis. She will see the patient in the office.  Pt given an additional dose of lasix. On reassessment he continues to feel improvement of sxs. He has had about 1100 cc urinary output since he arrived. Have advised him to keep his cardiology appointment for tomorrow. Advised on return precautions. He voices understanding and is in agreement with plan. All questions answered, pt stable for d/c  Case discussed with supervising physician, Dr. Melina Copa who is in agreement with plan.   Final Clinical Impression(s) / ED Diagnoses Final diagnoses:  Acute on chronic congestive heart failure, unspecified heart failure type [facility])    Rx / DC Orders ED Discharge Orders    None       Bishop Dublin 10/30/19 1505    Hayden Rasmussen, MD 10/30/19 1749

## 2019-10-30 NOTE — Discharge Instructions (Signed)
Please keep your appointment with your cardiology PA tomorrow.   Please return to the emergency department if you have any new or worsening symptoms.

## 2019-10-30 NOTE — ED Triage Notes (Signed)
Increasing SOB and Sleep Apnea for the past few days.  Has doctor's appt tomorrow but felt like he needed to come in today. Quit taking one of his medicines 4 days ago because it was making him more sluggish and somewhat confused.

## 2020-02-05 ENCOUNTER — Other Ambulatory Visit: Payer: Self-pay

## 2020-09-02 ENCOUNTER — Observation Stay (HOSPITAL_BASED_OUTPATIENT_CLINIC_OR_DEPARTMENT_OTHER)
Admission: EM | Admit: 2020-09-02 | Discharge: 2020-09-03 | Disposition: A | Discharge status: Home / Self Care | Payer: Medicare Other | Attending: Family Medicine | Admitting: Family Medicine

## 2020-09-02 ENCOUNTER — Encounter (HOSPITAL_BASED_OUTPATIENT_CLINIC_OR_DEPARTMENT_OTHER): Payer: Self-pay | Admitting: Emergency Medicine

## 2020-09-02 ENCOUNTER — Emergency Department (HOSPITAL_BASED_OUTPATIENT_CLINIC_OR_DEPARTMENT_OTHER): Payer: Medicare Other

## 2020-09-02 ENCOUNTER — Other Ambulatory Visit: Payer: Self-pay

## 2020-09-02 DIAGNOSIS — Z7901 Long term (current) use of anticoagulants: Secondary | ICD-10-CM | POA: Insufficient documentation

## 2020-09-02 DIAGNOSIS — Z20822 Contact with and (suspected) exposure to covid-19: Secondary | ICD-10-CM | POA: Diagnosis not present

## 2020-09-02 DIAGNOSIS — I130 Hypertensive heart and chronic kidney disease with heart failure and stage 1 through stage 4 chronic kidney disease, or unspecified chronic kidney disease: Secondary | ICD-10-CM | POA: Insufficient documentation

## 2020-09-02 DIAGNOSIS — Z79899 Other long term (current) drug therapy: Secondary | ICD-10-CM | POA: Diagnosis not present

## 2020-09-02 DIAGNOSIS — I10 Essential (primary) hypertension: Secondary | ICD-10-CM | POA: Diagnosis present

## 2020-09-02 DIAGNOSIS — N400 Enlarged prostate without lower urinary tract symptoms: Secondary | ICD-10-CM | POA: Insufficient documentation

## 2020-09-02 DIAGNOSIS — R748 Abnormal levels of other serum enzymes: Secondary | ICD-10-CM | POA: Insufficient documentation

## 2020-09-02 DIAGNOSIS — N1831 Chronic kidney disease, stage 3a: Secondary | ICD-10-CM | POA: Insufficient documentation

## 2020-09-02 DIAGNOSIS — M109 Gout, unspecified: Secondary | ICD-10-CM | POA: Diagnosis not present

## 2020-09-02 DIAGNOSIS — Z7982 Long term (current) use of aspirin: Secondary | ICD-10-CM | POA: Insufficient documentation

## 2020-09-02 DIAGNOSIS — N179 Acute kidney failure, unspecified: Secondary | ICD-10-CM | POA: Diagnosis not present

## 2020-09-02 DIAGNOSIS — I509 Heart failure, unspecified: Secondary | ICD-10-CM

## 2020-09-02 DIAGNOSIS — E785 Hyperlipidemia, unspecified: Secondary | ICD-10-CM | POA: Diagnosis not present

## 2020-09-02 DIAGNOSIS — R0602 Shortness of breath: Secondary | ICD-10-CM | POA: Diagnosis present

## 2020-09-02 DIAGNOSIS — I5043 Acute on chronic combined systolic (congestive) and diastolic (congestive) heart failure: Secondary | ICD-10-CM | POA: Diagnosis not present

## 2020-09-02 DIAGNOSIS — R778 Other specified abnormalities of plasma proteins: Secondary | ICD-10-CM | POA: Diagnosis present

## 2020-09-02 HISTORY — DX: Atrioventricular block, first degree: I44.0

## 2020-09-02 HISTORY — DX: Atherosclerotic heart disease of native coronary artery without angina pectoris: I25.10

## 2020-09-02 HISTORY — DX: Obstructive sleep apnea (adult) (pediatric): G47.33

## 2020-09-02 HISTORY — DX: Chronic systolic (congestive) heart failure: I50.22

## 2020-09-02 HISTORY — DX: Hyperlipidemia, unspecified: E78.5

## 2020-09-02 HISTORY — DX: Thoracic aortic aneurysm, without rupture, unspecified: I71.20

## 2020-09-02 HISTORY — DX: Other cardiomyopathies: I42.8

## 2020-09-02 HISTORY — DX: Nonrheumatic pulmonary valve insufficiency: I37.1

## 2020-09-02 HISTORY — DX: Unspecified right bundle-branch block: I45.10

## 2020-09-02 HISTORY — DX: Chronic kidney disease, stage 2 (mild): N18.2

## 2020-09-02 HISTORY — DX: Thoracic aortic aneurysm, without rupture: I71.2

## 2020-09-02 LAB — CBC WITH DIFFERENTIAL/PLATELET
Abs Immature Granulocytes: 0.01 10*3/uL (ref 0.00–0.07)
Basophils Absolute: 0 10*3/uL (ref 0.0–0.1)
Basophils Relative: 0 %
Eosinophils Absolute: 0 10*3/uL (ref 0.0–0.5)
Eosinophils Relative: 0 %
HCT: 46.4 % (ref 39.0–52.0)
Hemoglobin: 15.9 g/dL (ref 13.0–17.0)
Immature Granulocytes: 0 %
Lymphocytes Relative: 22 %
Lymphs Abs: 1.1 10*3/uL (ref 0.7–4.0)
MCH: 30.9 pg (ref 26.0–34.0)
MCHC: 34.3 g/dL (ref 30.0–36.0)
MCV: 90.1 fL (ref 80.0–100.0)
Monocytes Absolute: 0.5 10*3/uL (ref 0.1–1.0)
Monocytes Relative: 11 %
Neutro Abs: 3.3 10*3/uL (ref 1.7–7.7)
Neutrophils Relative %: 67 %
Platelets: 129 10*3/uL — ABNORMAL LOW (ref 150–400)
RBC: 5.15 MIL/uL (ref 4.22–5.81)
RDW: 17.2 % — ABNORMAL HIGH (ref 11.5–15.5)
WBC: 5 10*3/uL (ref 4.0–10.5)
nRBC: 0.4 % — ABNORMAL HIGH (ref 0.0–0.2)

## 2020-09-02 LAB — COMPREHENSIVE METABOLIC PANEL
ALT: 43 U/L (ref 0–44)
AST: 43 U/L — ABNORMAL HIGH (ref 15–41)
Albumin: 4.1 g/dL (ref 3.5–5.0)
Alkaline Phosphatase: 51 U/L (ref 38–126)
Anion gap: 12 (ref 5–15)
BUN: 43 mg/dL — ABNORMAL HIGH (ref 8–23)
CO2: 22 mmol/L (ref 22–32)
Calcium: 9.4 mg/dL (ref 8.9–10.3)
Chloride: 99 mmol/L (ref 98–111)
Creatinine, Ser: 2.29 mg/dL — ABNORMAL HIGH (ref 0.61–1.24)
GFR, Estimated: 30 mL/min — ABNORMAL LOW (ref >60)
Glucose, Bld: 122 mg/dL — ABNORMAL HIGH (ref 70–99)
Potassium: 4.6 mmol/L (ref 3.5–5.1)
Sodium: 133 mmol/L — ABNORMAL LOW (ref 135–145)
Total Bilirubin: 2 mg/dL — ABNORMAL HIGH (ref 0.3–1.2)
Total Protein: 7.4 g/dL (ref 6.5–8.1)

## 2020-09-02 LAB — BRAIN NATRIURETIC PEPTIDE: B Natriuretic Peptide: 3492.7 pg/mL — ABNORMAL HIGH (ref 0.0–100.0)

## 2020-09-02 LAB — TROPONIN I (HIGH SENSITIVITY)
Troponin I (High Sensitivity): 33 ng/L — ABNORMAL HIGH (ref <18)
Troponin I (High Sensitivity): 31 ng/L — ABNORMAL HIGH (ref <18)

## 2020-09-02 MED ORDER — FUROSEMIDE 10 MG/ML IJ SOLN
40.0000 mg | Freq: Once | INTRAMUSCULAR | Status: AC
  Administered 2020-09-02: 40 mg via INTRAVENOUS
  Filled 2020-09-02: qty 4

## 2020-09-02 NOTE — ED Notes (Signed)
Patient transported to X-ray 

## 2020-09-02 NOTE — ED Notes (Signed)
ED Provider at bedside. 

## 2020-09-02 NOTE — ED Triage Notes (Signed)
SOB x 4 months, worse on exertion. Able to speak in complete sentences and no distress noted at present, no CP

## 2020-09-02 NOTE — ED Provider Notes (Signed)
[facility] EMERGENCY DEPARTMENT Provider Note   CSN: [id number] Arrival date & time: 09/02/20  1904     History Chief Complaint  Patient presents with  . Shortness of Breath    Brandon Cuevas is a 70 y.o. male with past medical history significant for HTN, CAD, and CHF who presents to the ED with a 67-month history of dyspnea.  I reviewed patient's medical record and cardiogram obtained in 2018 demonstrated severely reduced LVEF to 20-25% with diffuse hypokinesis.  On my examination, patient reports that for the past 4 months he has been experiencing intermittent episodes of shortness of breath, always exertional.  He states that he has gained 8 pounds in the past week and has significantly worsening symptoms which prompted him to come to the ED for evaluation.  He reports that he almost passed out today at [employer] due to his fatigue and difficulty breathing with exertion.  He states that he recently changed cardiologists and is now seeing somebody at [facility].  He also reports that he is currently in the process of considering a defibrillator implant with one of his doctors.  Patient denies any chest pain at present, but states that sometimes he has a chest "tightness pain" when he is feeling short of breath.  Patient endorses mild orthopnea, but denies any significant peripheral swelling or edema.  He denies any complete syncope, cough, abdominal pain, blurred vision, numbness or weakness, nausea or vomiting, or other symptoms.  Patient is fully immunized for COVID-19 including booster.  HPI     Past Medical History:  Diagnosis Date  . CHF (congestive heart failure) (HCC)   . Enlarged prostate   . Gout   . Hypertension   . Systolic congestive heart failure [facility])     Patient Active Problem List   Diagnosis Date Noted  . Acute CHF (congestive heart failure) (HCC) 09/02/2020  . Thoracic ascending aortic aneurysm (HCC) 03/21/2017  . Chest pain 03/21/2017  .  Elevated troponin 03/21/2017  . Hypertension   . Gout   . Acute on chronic systolic CHF (congestive heart failure) (HCC) 03/20/2017    Past Surgical History:  Procedure Laterality Date  . CORONARY ANGIOPLASTY WITH STENT PLACEMENT         Family History  Problem Relation Age of Onset  . Hypertension Mother   . Heart disease Brother     Social History   Tobacco Use  . Smoking status: Never Smoker  . Smokeless tobacco: Never Used  Vaping Use  . Vaping Use: Never used  Substance Use Topics  . Alcohol use: No  . Drug use: No    Home Medications Prior to Admission medications   Medication Sig Start Date End Date Taking? Authorizing Provider  albuterol (PROVENTIL HFA;VENTOLIN HFA) 108 (90 Base) MCG/ACT inhaler Inhale 2 puffs into the lungs every 6 (six) hours as needed for wheezing or shortness of breath.    [provider]  allopurinol (ZYLOPRIM) 300 MG tablet Take 300 mg by mouth daily.    [provider]  aspirin EC 81 MG tablet Take 81 mg by mouth daily.    [provider]  carvedilol (COREG) 25 MG tablet Take 25 mg by mouth 2 (two) times daily with a meal.    [provider]  Colchicine (MITIGARE) 0.6 MG CAPS Take 0.6 mg by mouth daily as needed (gout attack).     [provider]  furosemide (LASIX) 40 MG tablet Take 1 tablet (40 mg total) by  mouth daily. 03/24/17   Vassie Loll, MD  losartan (COZAAR) 100 MG tablet Take 100 mg by mouth daily.    [provider]  Multiple Vitamin (MULTIVITAMIN WITH MINERALS) TABS tablet Take 1 tablet by mouth daily.    [provider]  OVER THE COUNTER MEDICATION Take 15 mLs by mouth See admin instructions. Take 15 ml liquid fish [id number] by mouth every morning    [provider]  potassium chloride SA (K-DUR,KLOR-CON) 20 MEQ tablet Take 20 mEq by mouth daily.     [provider]  spironolactone (ALDACTONE) 25 MG tablet Take 25 mg by mouth daily with supper.   02/26/17   [provider]  tamsulosin (FLOMAX) 0.4 MG CAPS capsule Take 0.4 mg by mouth daily. 03/03/17   [provider]    Allergies    Sulfa antibiotics  Review of Systems   Review of Systems  All other systems reviewed and are negative.   Physical Exam Updated Vital Signs BP 110/86   Pulse 76   Temp 97.8 F (36.6 C) (Oral)   Resp 20   Ht 5\' 7"  (1.702 m)   Wt 86.2 kg   SpO2 99%   BMI 29.76 kg/m   Physical Exam Vitals and nursing note reviewed. Exam conducted with a chaperone present.  Constitutional:      General: He is not in acute distress. HENT:     Head: Normocephalic and atraumatic.  Eyes:     General: No scleral icterus.    Conjunctiva/sclera: Conjunctivae normal.  Cardiovascular:     Rate and Rhythm: Normal rate and regular rhythm.     Pulses: Normal pulses.  Pulmonary:     Effort: Pulmonary effort is normal.  Abdominal:     General: Abdomen is flat. There is no distension.     Palpations: Abdomen is soft.     Tenderness: There is no abdominal tenderness.  Musculoskeletal:     Cervical back: Normal range of motion. No rigidity.     Comments: Trace bilateral lower extremity edema.  Skin:    General: Skin is dry.  Neurological:     Mental Status: He is alert.     GCS: GCS eye subscore is 4. GCS verbal subscore is 5. GCS motor subscore is 6.  Psychiatric:        Mood and Affect: Mood normal.        Behavior: Behavior normal.        Thought Content: Thought content normal.     ED Results / Procedures / Treatments   Labs (all labs ordered are listed, but only abnormal results are displayed) Labs Reviewed  CBC WITH DIFFERENTIAL/PLATELET - Abnormal; Notable for the following components:      Result Value   RDW 17.2 (*)    Platelets 129 (*)    nRBC 0.4 (*)    All other components within normal limits  COMPREHENSIVE METABOLIC PANEL - Abnormal; Notable for the following components:   Sodium 133 (*)    Glucose, Bld 122 (*)    BUN  43 (*)    Creatinine, Ser 2.29 (*)    AST 43 (*)    Total Bilirubin 2.0 (*)    GFR, Estimated 30 (*)    All other components within normal limits  BRAIN NATRIURETIC PEPTIDE - Abnormal; Notable for the following components:   B Natriuretic Peptide 3,492.7 (*)    All other components within normal limits  TROPONIN I (HIGH SENSITIVITY) - Abnormal; Notable for the  following components:   Troponin I (High Sensitivity) 33 (*)    All other components within normal limits  SARS CORONAVIRUS 2 (TAT 6-24 HRS)  TROPONIN I (HIGH SENSITIVITY)    EKG EKG Interpretation  Date/Time:  Monday September 02 2020 19:09:06 EST Ventricular Rate:  77 PR Interval:  210 QRS Duration: 178 QT Interval:  466 QTC Calculation: 527 R Axis:   -100 Text Interpretation: Sinus rhythm with 1st degree A-V block Biatrial enlargement Right bundle branch block , plus right ventricular hypertrophy Abnormal ECG Confirmed by Marianna Fussykstra, Richard ([id number]) on 09/02/2020 7:21:32 PM   Radiology DG Chest 2 View  Result Date: 09/02/2020 CLINICAL DATA:  Shortness of breath EXAM: CHEST - 2 VIEW COMPARISON:  10/30/2019 FINDINGS: Cardiomegaly. No focal opacity. Tiny right CP angle pleural effusion or thickening. Aortic atherosclerosis. No pneumothorax. IMPRESSION: Cardiomegaly with tiny right pleural effusion or thickening. Electronically Signed   By: Jasmine PangKim  Fujinaga M.D.   On: 09/02/2020 20:07    Procedures Procedures   Medications Ordered in ED Medications  furosemide (LASIX) injection 40 mg (40 mg Intravenous Given 09/02/20 2126)    ED Course  I have reviewed the triage vital signs and the nursing notes.  Pertinent labs & imaging results that were available during my care of the patient were reviewed by me and considered in my medical decision making (see chart for details).  Clinical Course as of 09/02/20 2207  Mon Sep 02, 2020  2206 I spoke with Dr. Toniann FailKakrakandy who will see and admit patient. [GG]    Clinical Course User  Index [GG] Lorelee NewGreen, Jacqulyn Barresi L, PA-C   MDM Rules/Calculators/A&P                          Lynden AngGregory Bordwell was evaluated in Emergency Department on 09/02/2020 for the symptoms described in the history of present illness. He was evaluated in the context of the global COVID-19 pandemic, which necessitated consideration that the patient might be at risk for infection with the SARS-CoV-2 virus that causes COVID-19. Institutional protocols and algorithms that pertain to the evaluation of patients at risk for COVID-19 are in a state of rapid change based on information released by regulatory bodies including the CDC and federal and state organizations. These policies and algorithms were followed during the patient's care in the ED.  I personally reviewed patient's medical chart and all notes from triage and staff during today's encounter. I have also ordered and reviewed all labs and imaging that I felt to be medically necessary in the evaluation of this patient's complaints and with consideration of their physical exam. If needed, translation services were available and utilized.   Patient with worsening dyspnea with exertion and near syncope in the context of known cardiovascular disease.  I reviewed patient's medical chart and he actually [facility].  And they have discussed cardiac resynchronization therapy.  He has known nonischemic cardiomyopathy with severe LV systolic function with estimated 15 to 20% EF.  He also has a known right bundle branch block with prolonged QRS.  He is on multiple medications including spironolactone and furosemide in addition to beta-blockers.  He had a coronary angiography in 2018 which showed mild obstructive disease out of proportion to his cardiomyopathy, suggestion that is nonischemic.  Chest x-ray is personally reviewed and demonstrates cardiomegaly with small right pleural effusion.  He has a BNP elevated at 3492.  Troponin obtained  is relatively consistent  with baseline at 33, anticipate that it remains stable.  CMP demonstrates creatinine that is elevated 2.29 and GFR reduced at 30, both of which worse from recent labs.  However, most recent labs were approximately 1 year ago and unsure as to whether or not this is AKI versus progression of CKD.  CBC is unremarkable.  Given patient's worsening exertional dyspnea and suspicion of CHF exacerbation, will treat with 40 IV Lasix here in the ED and consult hospitalist for admission.  His episodes of near syncope related to exertion is also cause for admission and he would likely benefit from cardiology consult in the morning.  Discussed case with Dr. Stevie Kern who personally evaluated patient and agrees with assessment and plan.    I spoke with Dr. Toniann Fail who will see and admit patient.   Final Clinical Impression(s) / ED Diagnoses Final diagnoses:  Acute on chronic combined systolic and diastolic congestive heart failure [facility])    Rx / DC Orders ED Discharge Orders    None       Elvera Maria 09/02/20 2207    Milagros Loll, MD 09/03/20 [phone]

## 2020-09-03 ENCOUNTER — Encounter (HOSPITAL_COMMUNITY): Payer: Self-pay | Admitting: Internal Medicine

## 2020-09-03 DIAGNOSIS — N179 Acute kidney failure, unspecified: Secondary | ICD-10-CM

## 2020-09-03 DIAGNOSIS — I10 Essential (primary) hypertension: Secondary | ICD-10-CM

## 2020-09-03 DIAGNOSIS — Z7982 Long term (current) use of aspirin: Secondary | ICD-10-CM | POA: Diagnosis not present

## 2020-09-03 DIAGNOSIS — I5043 Acute on chronic combined systolic (congestive) and diastolic (congestive) heart failure: Secondary | ICD-10-CM | POA: Diagnosis not present

## 2020-09-03 DIAGNOSIS — I130 Hypertensive heart and chronic kidney disease with heart failure and stage 1 through stage 4 chronic kidney disease, or unspecified chronic kidney disease: Secondary | ICD-10-CM | POA: Diagnosis not present

## 2020-09-03 DIAGNOSIS — M109 Gout, unspecified: Secondary | ICD-10-CM | POA: Diagnosis not present

## 2020-09-03 DIAGNOSIS — R0602 Shortness of breath: Secondary | ICD-10-CM | POA: Diagnosis present

## 2020-09-03 DIAGNOSIS — Z79899 Other long term (current) drug therapy: Secondary | ICD-10-CM | POA: Diagnosis not present

## 2020-09-03 DIAGNOSIS — R778 Other specified abnormalities of plasma proteins: Secondary | ICD-10-CM

## 2020-09-03 DIAGNOSIS — I5041 Acute combined systolic (congestive) and diastolic (congestive) heart failure: Secondary | ICD-10-CM | POA: Diagnosis not present

## 2020-09-03 DIAGNOSIS — N1831 Chronic kidney disease, stage 3a: Secondary | ICD-10-CM | POA: Diagnosis not present

## 2020-09-03 DIAGNOSIS — N400 Enlarged prostate without lower urinary tract symptoms: Secondary | ICD-10-CM | POA: Diagnosis not present

## 2020-09-03 DIAGNOSIS — R748 Abnormal levels of other serum enzymes: Secondary | ICD-10-CM | POA: Diagnosis not present

## 2020-09-03 DIAGNOSIS — Z7901 Long term (current) use of anticoagulants: Secondary | ICD-10-CM | POA: Diagnosis not present

## 2020-09-03 DIAGNOSIS — E785 Hyperlipidemia, unspecified: Secondary | ICD-10-CM | POA: Diagnosis not present

## 2020-09-03 DIAGNOSIS — Z20822 Contact with and (suspected) exposure to covid-19: Secondary | ICD-10-CM | POA: Diagnosis not present

## 2020-09-03 LAB — LIPID PANEL
Cholesterol: 130 mg/dL (ref 0–200)
HDL: 38 mg/dL — ABNORMAL LOW (ref >40)
LDL Cholesterol: 74 mg/dL (ref 0–99)
Total CHOL/HDL Ratio: 3.4 RATIO
Triglycerides: 92 mg/dL (ref <150)
VLDL: 18 mg/dL (ref 0–40)

## 2020-09-03 LAB — BASIC METABOLIC PANEL
Anion gap: 13 (ref 5–15)
BUN: 44 mg/dL — ABNORMAL HIGH (ref 8–23)
CO2: 23 mmol/L (ref 22–32)
Calcium: 9.7 mg/dL (ref 8.9–10.3)
Chloride: 100 mmol/L (ref 98–111)
Creatinine, Ser: 2.03 mg/dL — ABNORMAL HIGH (ref 0.61–1.24)
GFR, Estimated: 35 mL/min — ABNORMAL LOW (ref >60)
Glucose, Bld: 100 mg/dL — ABNORMAL HIGH (ref 70–99)
Potassium: 4 mmol/L (ref 3.5–5.1)
Sodium: 136 mmol/L (ref 135–145)

## 2020-09-03 LAB — SARS CORONAVIRUS 2 (TAT 6-24 HRS): SARS Coronavirus 2: NEGATIVE

## 2020-09-03 LAB — HIV ANTIBODY (ROUTINE TESTING W REFLEX): HIV Screen 4th Generation wRfx: NONREACTIVE

## 2020-09-03 LAB — MAGNESIUM: Magnesium: 2.4 mg/dL (ref 1.7–2.4)

## 2020-09-03 MED ORDER — TAMSULOSIN HCL 0.4 MG PO CAPS
0.4000 mg | ORAL_CAPSULE | Freq: Every day | ORAL | Status: DC
  Administered 2020-09-03: 0.4 mg via ORAL
  Filled 2020-09-03: qty 1

## 2020-09-03 MED ORDER — HEPARIN SODIUM (PORCINE) 5000 UNIT/ML IJ SOLN
5000.0000 [IU] | Freq: Three times a day (TID) | INTRAMUSCULAR | Status: DC
  Administered 2020-09-03: 5000 [IU] via SUBCUTANEOUS
  Filled 2020-09-03: qty 1

## 2020-09-03 MED ORDER — ACETAMINOPHEN 325 MG PO TABS: 650.0000 mg | ORAL_TABLET | Freq: Four times a day (QID) | ORAL | Status: DC | PRN

## 2020-09-03 MED ORDER — ALLOPURINOL 300 MG PO TABS
300.0000 mg | ORAL_TABLET | Freq: Every day | ORAL | Status: DC
  Administered 2020-09-03: 300 mg via ORAL
  Filled 2020-09-03: qty 1

## 2020-09-03 MED ORDER — ASPIRIN EC 81 MG PO TBEC
81.0000 mg | DELAYED_RELEASE_TABLET | Freq: Every day | ORAL | Status: DC
  Administered 2020-09-03: 81 mg via ORAL
  Filled 2020-09-03: qty 1

## 2020-09-03 MED ORDER — CARVEDILOL 25 MG PO TABS
25.0000 mg | ORAL_TABLET | Freq: Two times a day (BID) | ORAL | Status: DC
  Administered 2020-09-03: 25 mg via ORAL
  Filled 2020-09-03: qty 1

## 2020-09-03 MED ORDER — LIVING BETTER WITH HEART FAILURE BOOK: Freq: Once | Status: AC

## 2020-09-03 MED ORDER — FUROSEMIDE 10 MG/ML IJ SOLN
40.0000 mg | Freq: Two times a day (BID) | INTRAMUSCULAR | Status: DC
  Administered 2020-09-03: 40 mg via INTRAVENOUS
  Filled 2020-09-03: qty 4

## 2020-09-03 MED ORDER — ACETAMINOPHEN 650 MG RE SUPP: 650.0000 mg | Freq: Four times a day (QID) | RECTAL | Status: DC | PRN

## 2020-09-03 NOTE — H&P (Signed)
History and Physical    Brandon Cuevas ZOX:[id number] DOB: February 23, 1951 DOA: 09/02/2020  PCP: Daisy Floro, MD Patient coming from: [facility] ED  Chief Complaint: Shortness of breath  HPI: Brandon Cuevas is a 70 y.o. male with medical history significant of nonischemic cardiomyopathy with EF 15-20%, CAD, hypertension, hyperlipidemia, OSA, gout, CKD stage IIIa presented to the ED for evaluation of dyspnea on exertion for the past 4 months, weight gain, and a near syncopal episode.  In the ED, slightly tachypneic, remainder of vital signs stable.  Not hypoxic.  Labs showing WBC 5.0, hemoglobin 15.9, platelet count 129K (mildly low on previous labs as well). Sodium 133, potassium 4.6, chloride 99, bicarb 22, BUN 43, creatinine 2.2 (baseline 1.3), glucose 122.  BNP significantly elevated at 3492.  EKG showing sinus rhythm with bifascicular block.  High-sensitivity troponin mildly elevated but stable (33 > 31).  Screening Covid test pending.  Chest x-ray showing cardiomegaly with tiny right pleural effusion or thickening. Patient was given IV Lasix 40 mg.  Transferred from med [facility] ED to [facility] for management of CHF exacerbation.  Patient reports 56-month history of dyspnea on exertion and approximate 12 pound weight gain.  A few weeks ago while taking his garbage can out to the driveway he started feeling dizzy but did not pass out.  Denies any episodes of chest pain.  Reports compliance with home medications.  States he saw a cardiologist recently and they recommended defibrillator but he was thinking about it at that time but is now ready.  Patient has no other complaints.  He is fully vaccinated against COVID including booster shot.  Denies fevers, cough, nausea, vomiting, abdominal pain.  Review of Systems:  All systems reviewed and apart from history of presenting illness, are negative.  Past Medical History:  Diagnosis Date  . CHF (congestive heart failure) (HCC)   .  Enlarged prostate   . Gout   . Hypertension   . Systolic congestive heart failure [facility])     Past Surgical History:  Procedure Laterality Date  . CORONARY ANGIOPLASTY WITH STENT PLACEMENT       reports that he has never smoked. He has never used smokeless tobacco. He reports that he does not drink alcohol and does not use drugs.  Allergies  Allergen Reactions  . Sulfa Antibiotics Rash    Family History  Problem Relation Age of Onset  . Hypertension Mother   . Heart disease Brother     Prior to Admission medications   Medication Sig Start Date End Date Taking? Authorizing Provider  albuterol (PROVENTIL HFA;VENTOLIN HFA) 108 (90 Base) MCG/ACT inhaler Inhale 2 puffs into the lungs every 6 (six) hours as needed for wheezing or shortness of breath.   Yes [provider]  allopurinol (ZYLOPRIM) 300 MG tablet Take 300 mg by mouth daily.   Yes [provider]  aspirin EC 81 MG tablet Take 81 mg by mouth daily.   Yes [provider]  carvedilol (COREG) 25 MG tablet Take 25 mg by mouth 2 (two) times daily with a meal.   Yes [provider]  Colchicine 0.6 MG CAPS Take 0.6 mg by mouth daily as needed (gout attack).    Yes [provider]  furosemide (LASIX) 40 MG tablet Take 1 tablet (40 mg total) by mouth daily. 03/24/17  Yes Vassie Loll, MD  losartan (COZAAR) 100 MG tablet Take 100 mg by mouth daily.   Yes [provider]  Multiple Vitamin (  MULTIVITAMIN WITH MINERALS) TABS tablet Take 1 tablet by mouth See admin instructions. 3 times a week   Yes [provider]  OVER THE COUNTER MEDICATION Take 15 mLs by mouth See admin instructions. Take 15 ml liquid fish [id number] by mouth every morning   Yes [provider]  potassium chloride SA (K-DUR,KLOR-CON) 20 MEQ tablet Take 20 mEq by mouth daily.    Yes [provider]  spironolactone (ALDACTONE) 25 MG tablet Take 25 mg by mouth daily with supper.  02/26/17  Yes  [provider]  tamsulosin (FLOMAX) 0.4 MG CAPS capsule Take 0.4 mg by mouth daily. 03/03/17  Yes [provider]    Physical Exam: Vitals:   09/02/20 2000 09/02/20 2100 09/02/20 2128 09/02/20 2130  BP: (!) 129/94 120/88 110/86 110/86  Pulse: 71 77 76 76  Resp: (!) 22 (!) 22 17 20   Temp:      TempSrc:      SpO2: 97% 98% 100% 99%  Weight:      Height:        Physical Exam Constitutional:      General: He is not in acute distress. HENT:     Head: Normocephalic and atraumatic.  Eyes:     Extraocular Movements: Extraocular movements intact.     Conjunctiva/sclera: Conjunctivae normal.  Cardiovascular:     Rate and Rhythm: Normal rate and regular rhythm.     Pulses: Normal pulses.  Pulmonary:     Effort: Pulmonary effort is normal. No respiratory distress.     Breath sounds: No wheezing or rales.  Abdominal:     General: Bowel sounds are normal.     Palpations: Abdomen is soft.     Tenderness: There is no abdominal tenderness.  Musculoskeletal:     Cervical back: Normal range of motion and neck supple.     Comments: +1 bilateral pedal edema  Skin:    General: Skin is warm and dry.  Neurological:     General: No focal deficit present.     Mental Status: He is alert and oriented to person, place, and time.     Labs on Admission: I have personally reviewed following labs and imaging studies  CBC: Recent Labs  Lab 09/02/20 1925  WBC 5.0  NEUTROABS 3.3  HGB 15.9  HCT 46.4  MCV 90.1  PLT 129*   Basic Metabolic Panel: Recent Labs  Lab 09/02/20 1925  NA 133*  K 4.6  CL 99  CO2 22  GLUCOSE 122*  BUN 43*  CREATININE 2.29*  CALCIUM 9.4   GFR: Estimated Creatinine Clearance: 32.4 mL/min (A) (by C-G formula based on SCr of 2.29 mg/dL (H)). Liver Function Tests: Recent Labs  Lab 09/02/20 1925  AST 43*  ALT 43  ALKPHOS 51  BILITOT 2.0*  PROT 7.4  ALBUMIN 4.1   No results for input(s): LIPASE, AMYLASE in the last 168 hours. No results  for input(s): AMMONIA in the last 168 hours. Coagulation Profile: No results for input(s): INR, PROTIME in the last 168 hours. Cardiac Enzymes: No results for input(s): CKTOTAL, CKMB, CKMBINDEX, TROPONINI in the last 168 hours. BNP (last 3 results) No results for input(s): PROBNP in the last 8760 hours. HbA1C: No results for input(s): HGBA1C in the last 72 hours. CBG: No results for input(s): GLUCAP in the last 168 hours. Lipid Profile: No results for input(s): CHOL, HDL, LDLCALC, TRIG, CHOLHDL, LDLDIRECT in the last 72 hours. Thyroid Function Tests: No results for input(s): TSH, T4TOTAL, FREET4,  T3FREE, THYROIDAB in the last 72 hours. Anemia Panel: No results for input(s): VITAMINB12, FOLATE, FERRITIN, TIBC, IRON, RETICCTPCT in the last 72 hours. Urine analysis:    Component Value Date/Time   COLORURINE YELLOW 10/30/2019 1135   APPEARANCEUR CLEAR 10/30/2019 1135   LABSPEC 1.020 10/30/2019 1135   PHURINE 6.0 10/30/2019 1135   GLUCOSEU NEGATIVE 10/30/2019 1135   HGBUR NEGATIVE 10/30/2019 1135   BILIRUBINUR NEGATIVE 10/30/2019 1135   KETONESUR NEGATIVE 10/30/2019 1135   PROTEINUR NEGATIVE 10/30/2019 1135   NITRITE NEGATIVE 10/30/2019 1135   LEUKOCYTESUR NEGATIVE 10/30/2019 1135    Radiological Exams on Admission: DG Chest 2 View  Result Date: 09/02/2020 CLINICAL DATA:  Shortness of breath EXAM: CHEST - 2 VIEW COMPARISON:  10/30/2019 FINDINGS: Cardiomegaly. No focal opacity. Tiny right CP angle pleural effusion or thickening. Aortic atherosclerosis. No pneumothorax. IMPRESSION: Cardiomegaly with tiny right pleural effusion or thickening. Electronically Signed   By: Jasmine Pang M.D.   On: 09/02/2020 20:07    EKG: Independently reviewed.  Sinus rhythm with first-degree AV block, LAFB, RBBB, QTC 527.  No prior tracing for comparison.  Assessment/Plan Principal Problem:   Acute CHF (congestive heart failure) (HCC) Active Problems:   Hypertension   Gout   Elevated troponin    AKI (acute kidney injury) (HCC)   Acute exacerbation of chronic combined systolic and diastolic CHF/nonischemic cardiomyopathy: Echo done in October 2021 revealed EF of 15 to 20% with severe hypokinesis and moderate diastolic dysfunction.  Nuclear medicine study done at that time did not show evidence of inducible ischemia.  Patient was seen by EP at [facility] on 07/22/2020 and ICD implantation was discussed but patient wanted to deliberate further.  Patient is here for evaluation of dyspnea on exertion and weight gain for the past 3 months.  He is symptomatic with minimal exertion and had an episode of near syncope a few weeks ago. BNP significantly elevated at 3492.  Chest x-ray showing cardiomegaly with a tiny right pleural effusion or thickening; no overt pulmonary edema.  Patient is currently comfortable at rest and not hypoxic. -Cardiac monitoring.  Continue IV Lasix 40 mg twice daily.  Continue home Coreg.  Hold losartan and spironolactone given AKI.  Monitor intake and output, daily weights.  Low-sodium diet with fluid restriction.  Consult advanced heart failure team in the morning.  Mild troponin elevation: Mild troponin elevation likely due to demand ischemia from decompensated heart failure.  Per review of cardiology notes from care everywhere, he has a known history of bifascicular block on EKG which is again seen today.  ACS less likely as high-sensitivity troponin only mildly elevated and stable (33 > 31).  He is not endorsing any chest pain. -Cardiac monitoring.  Continue home aspirin and Coreg.  He is not on a statin.  AKI on CKD stage IIIa: Creatinine currently 2.2 and baseline appears to be around 1.3.  Suspect cardiorenal from acutely decompensated CHF. -Continue diuresis with Lasix as mentioned above and monitor renal function closely.  Avoid any other nephrotoxic agents.  Hold home losartan and spironolactone.  Hypertension: Normotensive. -Hold home losartan and spironolactone given  AKI.  Continue Coreg.  Hyperlipidemia: Per pharmacy med rec, not on lipid-lowering therapy. -Check lipid panel  Gout -Continue allopurinol  QT prolongation -Cardiac monitoring.  Monitor potassium and magnesium levels.  Avoid QT prolonging drugs if possible.  BPH -Continue Flomax  DVT prophylaxis: Subcutaneous heparin Code Status: Patient wishes to be full code. Family Communication: No family available at this time.  Diagnostic  findings and treatment plan discussed with the patient. Disposition Plan: Status is: Observation  The patient remains OBS appropriate and will d/c before 2 midnights.  Dispo: The patient is from: Home              Anticipated d/c is to: Home              Anticipated d/c date is: 2 days              Patient currently is not medically stable to d/c.   Difficult to place patient No  Level of care: Level of care: Telemetry   The medical decision making on this patient was of high complexity and the patient is at high risk for clinical deterioration, therefore this is a level 3 visit.  John GiovanniVasundhra Rathore MD [facility]  If 7PM-7AM, please contact night-coverage [email or web address]  09/03/2020, 3:56 AM

## 2020-09-03 NOTE — Discharge Instructions (Signed)
Heart Failure, Self-Care Heart failure is a serious condition. The following information explains things you need to do to take care of yourself at home. To help you stay as healthy as possible, you may be asked to change your diet, take certain medicines, and make other changes in your life. Your doctor may also give you more specific instructions. If you have problems or questions, call your doctor. What are the risks? Having heart failure makes it more likely for you to have some problems. These problems can get worse if you do not take good care of yourself. Problems may include:  Damage to the kidneys, liver, or lungs.  Malnutrition.  Abnormal heart rhythms.  Blood clotting problems that could cause a stroke. Supplies needed:  Scale for weighing yourself.  Blood pressure monitor.  Notebook.  Medicines. How to care for yourself when you have heart failure Medicines Take over-the-counter and prescription medicines only as told by your doctor. Take your medicines every day.  Do not stop taking your medicine unless your doctor tells you to do so.  Do not skip any medicines.  Get your prescriptions refilled before you run out of medicine. This is important.  Talk with your doctor if you cannot afford your medicines. Eating and drinking  Eat heart-healthy foods. Talk with a diet specialist (dietitian) to create an eating plan.  Limit salt (sodium) if told by your doctor. Ask your diet specialist to tell you which seasonings are healthy for your heart.  Cook in healthy ways instead of frying. Healthy ways of cooking include roasting, grilling, broiling, baking, poaching, steaming, and stir-frying.  Choose foods that: ? Have no trans fat. ? Are low in saturated fat and cholesterol.  Choose healthy foods, such as: ? Fresh or frozen fruits and vegetables. ? Fish. ? Low-fat (lean) meats. ? Legumes, such as beans, peas, and lentils. ? Fat-free or low-fat dairy  products. ? Whole-grain foods. ? High-fiber foods.  Limit how much fluid you drink, if told by your doctor.   Alcohol use  Do not drink alcohol if: ? Your doctor tells you not to drink. ? Your heart was damaged by alcohol, or you have very bad heart failure. ? You are pregnant, may be pregnant, or are planning to become pregnant.  If you drink alcohol: ? Limit how much you have to:  0-1 drink a day for women.  0-2 drinks a day for men. ? Know how much alcohol is in your drink. In the [place] one drink equals one 12 oz bottle of beer (355 mL), one 5 oz glass of wine (148 mL), or one 1 oz glass of hard liquor (44 mL). Lifestyle  Do not smoke or use any products that contain nicotine or tobacco. If you need help quitting, ask your doctor. ? Do not use nicotine gum or patches before talking to your doctor.  Do not use illegal drugs.  Lose weight if told by your doctor.  Do physical activity if told by your doctor. Talk to your doctor before you begin an exercise if: ? You are an older adult. ? You have very bad heart failure.  Learn to manage stress. If you need help, ask your doctor.  Get physical rehab (rehabilitation) to help you stay independent and to help with your quality of life.  Participate in a cardiac rehab program. This program helps you improve your health through exercise, education, and counseling.  Plan time to rest when you get tired.   Check weight   and blood pressure  Weigh yourself every day. This will help you to know if fluid is building up in your body. ? Weigh yourself every morning after you pee (urinate) and before you eat breakfast. ? Wear the same amount of clothing each time. ? Write down your daily weight. Give your record to your doctor.  Check and write down your blood pressure as told by your doctor.  Check your pulse as told by your doctor.   Dealing with very hot and very cold weather  If it is very hot: ? Avoid activities that take a  lot of energy. ? Use air conditioning or fans, or find a cooler place. ? Avoid caffeine and alcohol. ? Wear clothing that is loose-fitting, lightweight, and light-colored.  If it is very cold: ? Avoid activities that take a lot of energy. ? Layer your clothes. ? Wear mittens or gloves, a hat, and a face covering when you go outside. ? Avoid alcohol. Follow these instructions at home:  Stay up to date with shots (vaccines). Get pneumococcal and flu (influenza) shots.  Keep all follow-up visits. Contact a doctor if:  You gain 2-3 lb (1-1.4 kg) in 24 hours or 5 lb (2.3 kg) in a week.  You have increasing shortness of breath.  You cannot do your normal activities.  You get tired easily.  You cough a lot.  You do not feel like eating or feel like you may vomit (nauseous).  You have swelling in your hands, feet, ankles, or belly (abdomen).  You cannot sleep well because it is hard to breathe.  You feel like your heart is beating fast (palpitations).  You get dizzy when you stand up.  You feel depressed or sad. Get help right away if:  You have trouble breathing.  You or someone else notices a change in your behavior, such as having trouble staying awake.  You have chest pain or discomfort.  You pass out (faint). These symptoms may be an emergency. Get help right away. Call your local emergency services (911 in the [place]  Do not wait to see if the symptoms will go away.  Do not drive yourself to the hospital. Summary  Heart failure is a serious condition. To care for yourself, you may have to change your diet, take medicines, and make other lifestyle changes.  Take your medicines every day. Do not stop taking them unless your doctor tells you to do so.  Limit salt and eat heart-healthy foods.  Ask your doctor if you can drink alcohol. You may have to stop alcohol use if you have very bad heart failure.  Contact your doctor if you gain weight quickly or feel  that your heart is beating too fast. Get help right away if you pass out or have chest pain or trouble breathing. This information is not intended to replace advice given to you by your health care provider. Make sure you discuss any questions you have with your health care provider. Document Revised: 01/20/2020 Document Reviewed: 01/20/2020 Elsevier Patient Education  2021 [employer]  

## 2020-09-03 NOTE — Plan of Care (Signed)
Pt arrived to [facility] 1404 in [facility] by EMS. Pt ambulated to bed from stretcher with a steady gait. Telemetry connected and VSS.

## 2020-09-03 NOTE — Care Management Obs Status (Signed)
MEDICARE OBSERVATION STATUS NOTIFICATION   Patient Details  Name: Brandon Cuevas MRN: [id number] Date of Birth: 1951/02/07   Medicare Observation Status Notification Given:  Yes    MahabirOlegario Messier, RN 09/03/2020, 11:40 AM

## 2020-09-03 NOTE — Discharge Summary (Signed)
Physician Discharge Summary [facility]    Patient: Brandon Cuevas                   Admit date: 09/02/2020   DOB: 30-Jul-1950             Discharge date:09/03/2020/11:49 AM [id number]                          PCP: Daisy Floro, MD  Disposition: HOME  Recommendations for Outpatient Follow-up:   . Follow up: in 1 week  Discharge Condition: Stable   Code Status:   Code Status: Full Code  Diet recommendation: Cardiac diet   Discharge Diagnoses:    Principal Problem:   Acute CHF (congestive heart failure) (HCC) Active Problems:   Hypertension   Gout   Elevated troponin   AKI (acute kidney injury) (HCC)   History of Present Illness/ Hospital Course Charline Bills Summary:     Brandon Cuevas is a 70 y.o. male with medical history significant of nonischemic cardiomyopathy with EF 15-20%, CAD, hypertension, hyperlipidemia, OSA, gout, CKD stage IIIa presented to the ED for evaluation of dyspnea on exertion for the past 4 months, weight gain, and a near syncopal episode.  In the ED, slightly tachypneic, remainder of vital signs stable.  Not hypoxic.  Labs showing WBC 5.0, hemoglobin 15.9, platelet count 129K (mildly low on previous labs as well). Sodium 133, potassium 4.6, chloride 99, bicarb 22, BUN 43, creatinine 2.2 (baseline 1.3), glucose 122.  BNP significantly elevated at 3492.  EKG showing sinus rhythm with bifascicular block.  High-sensitivity troponin mildly elevated but stable (33 > 31).  Screening Covid test pending.  Chest x-ray showing cardiomegaly with tiny right pleural effusion or thickening. Patient was given IV Lasix 40 mg.  Transferred from [facility] to [facility] for management of CHF exacerbation.  Patient reports 42-month history of dyspnea on exertion and approximate 12 pound weight gain.  A few weeks ago while taking his garbage can out to the driveway he started feeling dizzy but did not pass out   Acute exacerbation of chronic  combined systolic and diastolic CHF/nonischemic cardiomyopathy:  Echo done in October 2021 revealed EF of 15 to 20% with severe hypokinesis and moderate diastolic dysfunction.  Nuclear medicine study done at that time did not show evidence of inducible ischemia.  - Patient was seen by EP at [facility] on 07/22/2020 and ICD implantation was discussed but patient wanted to deliberate further. -On this admission patient was treated for volume overload load, CHF exacerbation with IV Lasix which she responded well... His home medication spironolactone and losartan was on held, at this point discontinued due to elevated creatinine from baseline  -He is to continue Lasix 40 mg daily, to take an extra dose if he gains weight of 3-5 pounds in 24 hours or develop shortness of breath -Cardiology consulted, patient was seen and evaluated, recommended Entresto, but affordability is a concerns, he is to follow-up with his primary cardiologist regarding discussion of cost and affordability -He is instructed to monitor his daily weight, low-sodium diet,   Mild troponin elevation: -Denies any chest pain  Mild troponin elevation likely due to demand ischemia from decompensated heart failure.  Per review of cardiology notes from care everywhere, he has a known history of bifascicular block on EKG which is again seen today.  ACS less likely as high-sensitivity troponin only mildly elevated and stable (33 > 31).  He is not endorsing any chest pain. -Cardiac monitoring.  Continue home aspirin and Coreg.   He is not on a statin... LDL at goal 74  AKI on CKD stage IIIa:  Creatinine currently 2.29 >>> 2.03   Baseline appears to be around 1.3.  Suspect cardiorenal from acutely decompensated CHF. -Nephrotoxins such as losartan spironolactone was held on this admission... To be restarted at a later date as an outpatient -IV Lasix was switched to p.o.   Hypertension: Normotensive. -Hold home losartan and spironolactone  given AKI.  Continue Coreg. -Discontinued losartan and spironolactone due to AKI -Patient is to continue Coreg and Lasix  Hyperlipidemia:  - Not on any statins, LDL 74, HDL 38, total cholesterol 474, triglyceride 92 Gout -Continue allopurinol QT prolongation -Cardiac monitoring.   -Electrolytes magnesium potassium will monitor and was repleted accordingly BPH -Continue Flomax   Code Status: Patient wishes to be full code. Family Communication:  No family member present at bedside, discussed with patient in detail.  Dispo: The patient is from: Home  Anticipated d/c is to: Home  Nutritional status:       The patient's BMI is: Body mass index is 28.56 kg/m. I agree with the assessment and plan as outlined below:     Discharge Instructions:   Discharge Instructions    Activity as tolerated - No restrictions   Complete by: As directed    Diet - low sodium heart healthy   Complete by: As directed    Discharge instructions   Complete by: As directed    Follow-up with your cardiologist regarding recommendation of medication Entresto. At this point due to your worsening kidney function we have held spironolactone and losartan. You are to continue Lasix 40 mg daily, if gaining weight of 3-5 pounds, or progressive shortness of breath -  within 24 hours extra dose may be taken.   Increase activity slowly   Complete by: As directed        Medication List    STOP taking these medications   losartan 100 MG tablet Commonly known as: COZAAR   spironolactone 25 MG tablet Commonly known as: ALDACTONE     TAKE these medications   albuterol 108 (90 Base) MCG/ACT inhaler Commonly known as: VENTOLIN HFA Inhale 2 puffs into the lungs every 6 (six) hours as needed for wheezing or shortness of breath.   allopurinol 300 MG tablet Commonly known as: ZYLOPRIM Take 300 mg by mouth daily.   aspirin EC 81 MG tablet Take 81 mg by mouth daily.   carvedilol 25 MG  tablet Commonly known as: COREG Take 25 mg by mouth 2 (two) times daily with a meal.   Colchicine 0.6 MG Caps Take 0.6 mg by mouth daily as needed (gout attack).   furosemide 40 MG tablet Commonly known as: LASIX Take 1 tablet (40 mg total) by mouth daily.   multivitamin with minerals Tabs tablet Take 1 tablet by mouth See admin instructions. 3 times a week   OVER THE COUNTER MEDICATION Take 15 mLs by mouth See admin instructions. Take 15 ml liquid fish [id number] by mouth every morning   potassium chloride SA 20 MEQ tablet Commonly known as: KLOR-CON Take 20 mEq by mouth daily.   tamsulosin 0.4 MG Caps capsule Commonly known as: FLOMAX Take 0.4 mg by mouth daily.       Allergies  Allergen Reactions  . Sulfa Antibiotics Rash     Procedures /Studies:   DG Chest 2 View  Result Date:  09/02/2020 CLINICAL DATA:  Shortness of breath EXAM: CHEST - 2 VIEW COMPARISON:  10/30/2019 FINDINGS: Cardiomegaly. No focal opacity. Tiny right CP angle pleural effusion or thickening. Aortic atherosclerosis. No pneumothorax. IMPRESSION: Cardiomegaly with tiny right pleural effusion or thickening. Electronically Signed   By: Jasmine PangKim  Fujinaga M.D.   On: 09/02/2020 20:07     Subjective:   Patient was seen and examined 09/03/2020, 11:49 AM Patient stable today. No acute distress.  No issues overnight Stable for discharge.  Discharge Exam:    Vitals:   09/03/20 0450 09/03/20 0500 09/03/20 0800 09/03/20 1046  BP: 129/89  122/77   Pulse: 77  77   Resp: 16  16   Temp: 97.8 F (36.6 C)  97.7 F (36.5 C)   TempSrc: Oral  Oral   SpO2: 100%  100%   Weight:  86.2 kg  82.7 kg  Height:        General: Pt lying comfortably in bed & appears in no obvious distress. Cardiovascular: S1 & S2 heard, RRR, S1/S2 +. No murmurs, rubs, gallops or clicks. No JVD or pedal edema. Respiratory: Clear to auscultation without wheezing, rhonchi or crackles. No increased work of breathing. Abdominal:   Non-distended, non-tender & soft. No organomegaly or masses appreciated. Normal bowel sounds heard. CNS: Alert and oriented. No focal deficits. Extremities: no edema, no cyanosis      The results of significant diagnostics from this hospitalization (including imaging, microbiology, ancillary and laboratory) are listed below for reference.      Microbiology:   No results found for this or any previous visit (from the past 240 hour(s)).   Labs:   CBC: Recent Labs  Lab 09/02/20 1925  WBC 5.0  NEUTROABS 3.3  HGB 15.9  HCT 46.4  MCV 90.1  PLT 129*   Basic Metabolic Panel: Recent Labs  Lab 09/02/20 1925 09/03/20 0533  NA 133* 136  K 4.6 4.0  CL 99 100  CO2 22 23  GLUCOSE 122* 100*  BUN 43* 44*  CREATININE 2.29* 2.03*  CALCIUM 9.4 9.7  MG  --  2.4   Liver Function Tests: Recent Labs  Lab 09/02/20 1925  AST 43*  ALT 43  ALKPHOS 51  BILITOT 2.0*  PROT 7.4  ALBUMIN 4.1   BNP (last 3 results) Recent Labs    10/30/19 1019 09/02/20 1925  BNP 4,392.3* 3,492.7*   Cardiac Enzymes: No results for input(s): CKTOTAL, CKMB, CKMBINDEX, TROPONINI in the last 168 hours. CBG: No results for input(s): GLUCAP in the last 168 hours. Hgb A1c No results for input(s): HGBA1C in the last 72 hours. Lipid Profile Recent Labs    09/03/20 0533  CHOL 130  HDL 38*  LDLCALC 74  TRIG 92  CHOLHDL 3.4   Thyroid function studies No results for input(s): TSH, T4TOTAL, T3FREE, THYROIDAB in the last 72 hours.  Invalid input(s): FREET3 Anemia work up No results for input(s): VITAMINB12, FOLATE, FERRITIN, TIBC, IRON, RETICCTPCT in the last 72 hours. Urinalysis    Component Value Date/Time   COLORURINE YELLOW 10/30/2019 1135   APPEARANCEUR CLEAR 10/30/2019 1135   LABSPEC 1.020 10/30/2019 1135   PHURINE 6.0 10/30/2019 1135   GLUCOSEU NEGATIVE 10/30/2019 1135   HGBUR NEGATIVE 10/30/2019 1135   BILIRUBINUR NEGATIVE 10/30/2019 1135   KETONESUR NEGATIVE 10/30/2019 1135    PROTEINUR NEGATIVE 10/30/2019 1135   NITRITE NEGATIVE 10/30/2019 1135   LEUKOCYTESUR NEGATIVE 10/30/2019 1135       Time coordinating discharge: Over 45 minutes  SIGNED: Guadalupe MapleSeyed  Perlie Mayo, MD, FACP, FHM. [facility],  Please use [email or web address] to Page If 7PM-7AM, please contact night-coverage [email or web address] 09/03/2020, 11:49 AM

## 2020-09-03 NOTE — Plan of Care (Signed)

## 2020-09-03 NOTE — Progress Notes (Signed)
Patient has been discharged. No change from am assessment. A&Ox4, ambulatory without assistance. Instructions reviewed. CHF education reviewed. Pt was encouraged to schedule appointments with PCP. Cardiologist appt has been scheduled for 2 weeks. Pt encouraged to contact office regarding continued medication regimen. Question, concerns denied at this time.

## 2020-09-03 NOTE — Consult Note (Addendum)
Cardiology Consultation:   Patient ID: Brandon Cuevas MRN: [id number]; DOB: Sep 19, 1950  Admit date: 09/02/2020 Date of Consult: 09/03/2020  PCP:  Daisy Cuevas, Brandon Alan, MD   [facility]  Cardiologist:  [facility] Provider:  No care team member to display Electrophysiologist:  None   Patient Profile:   Brandon Cuevas is a 70 y.o. 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 Loney Lohathore.  History of Present Illness:   [name] [facility] then [facility] recently [facility]. Records reviewed. Per their notes he was diagnosed with NICM in 2014. The etiology of his cardiomyopathy is unclear. He denies any history of MI. He would periodically get strep throat prior to his CHF diagnosis but nothing acutely correlating with its onset. His brother and maternal grandmother had heart issues but details unknown. He had a cath in 2018 showing 75% OM1, 50% OM2, 35% RCA, LAD listed as "abnormal" but no further details mentioned - summary states nonobstructive disease of RCA and LAD. His OM1 stenosis was felt out of proportion to cardiomyopathy so no PCI performed. He has been managed medically. Updated nuclear stress test 04/2020 showed no inducible ischemia; severely reduced LV function. Last echo 04/2020 showed EF 15-20%, mild AI, mild MR/TR, moderate PR, ascending aorta and aortic root borderline dilated by echo 04/2020. He recently saw [facility] EP to consider ICD in 07/2020 but was hesitant to commit to a decision.  He reports for the past 3 months he's had waxing and waning DOE that would improve and then resolve so he initially thought it was  minor. He has been eating a lot of [employer] chicken noodle soup. He would also have periodic orthopnea. The last few days he had worsening SOB with exertion as well as some dizziness so came to the ER. He's also had mild ankle edema. He reports home weight is around 175-176 and he had been seeing numbers around 178 which was a few lb up. Per [facility] notes he was 184lb on 1/10 and was 190 on arrival here. F/u weight clarified at 182.4.  He denies any chest pain, palpitations or syncope. Labs reveal BNP 3492, hsTroponin 33-31, hyponatremia of 133, AKI on CKD with Cr 2.29 (follow-up 2.03 today), AST 43, mild thrombocytopenia of 129. Covid test is pending. CXR shows cardiomegaly with tiny right pleural effusion or thickening. He's been started on 40mg  IV Lasix BID. His home losartan, spironolactone and potassium have been held due to the AKI. His weight is logged as the same value as yesterday and I/O's not fully complete. However, the patient states he is symptomatically back to baseline and wishes to go home. [facility] to let [facility] know he has decided to pursue defibrillator. He did not want to have a repeat echo here because he is concerned about cost since he just had an echo in October.   Past Medical History:  Diagnosis Date  . CAD (coronary artery disease)    a. cath in 2018 showing mainly nonobstructing out of proportion to cardiomyopathy, no PCI.   Marland Kitchen. Chronic systolic heart failure (HCC)   .  CKD (chronic kidney disease), stage II   . Enlarged prostate   . First degree AV block   . Gout   . Hyperlipidemia   . Hypertension   . NICM (nonischemic cardiomyopathy) (HCC)   . OSA (obstructive sleep apnea)   . Pulmonary regurgitation   . RBBB   . Thoracic aortic aneurysm (HCC)    4cm by CT 2018    Past Surgical History:  Procedure Laterality Date  . CARDIAC CATHETERIZATION       Home Medications:  Prior to Admission medications   Medication Sig Start Date End  Date Taking? Authorizing Provider  albuterol (PROVENTIL HFA;VENTOLIN HFA) 108 (90 Base) MCG/ACT inhaler Inhale 2 puffs into the lungs every 6 (six) hours as needed for wheezing or shortness of breath.   Yes [provider]  allopurinol (ZYLOPRIM) 300 MG tablet Take 300 mg by mouth daily.   Yes [provider]  aspirin EC 81 MG tablet Take 81 mg by mouth daily.   Yes [provider]  carvedilol (COREG) 25 MG tablet Take 25 mg by mouth 2 (two) times daily with a meal.   Yes [provider]  Colchicine 0.6 MG CAPS Take 0.6 mg by mouth daily as needed (gout attack).    Yes [provider]  furosemide (LASIX) 40 MG tablet Take 1 tablet (40 mg total) by mouth daily. 03/24/17  Yes Vassie Loll, MD  losartan (COZAAR) 100 MG tablet Take 100 mg by mouth daily.   Yes [provider]  Multiple Vitamin (MULTIVITAMIN WITH MINERALS) TABS tablet Take 1 tablet by mouth See admin instructions. 3 times a week   Yes [provider]  OVER THE COUNTER MEDICATION Take 15 mLs by mouth See admin instructions. Take 15 ml liquid fish [id number] by mouth every morning   Yes [provider]  potassium chloride SA (K-DUR,KLOR-CON) 20 MEQ tablet Take 20 mEq by mouth daily.    Yes [provider]  spironolactone (ALDACTONE) 25 MG tablet Take 25 mg by mouth daily with supper.  02/26/17  Yes [provider]  tamsulosin (FLOMAX) 0.4 MG CAPS capsule Take 0.4 mg by mouth daily. 03/03/17  Yes [provider]    Inpatient Medications: Scheduled Meds: . allopurinol  300 mg Oral Daily  . aspirin EC  81 mg Oral Daily  . carvedilol  25 mg Oral BID WC  . furosemide  40 mg Intravenous BID  . heparin  5,000 Units Subcutaneous Q8H  . tamsulosin  0.4 mg Oral Daily   Continuous Infusions:  PRN Meds: acetaminophen **OR** acetaminophen  Allergies:    Allergies  Allergen Reactions  . Sulfa Antibiotics Rash    Social History:   Social  History   Socioeconomic History  . Marital status: Married    Spouse name: Not on file  . Number of children: Not on file  . Years of education: Not on file  . Highest education level: Not on file  Occupational History  . Not on file  Tobacco Use  . Smoking status: Never Smoker  . Smokeless tobacco: Never Used  Vaping Use  . Vaping Use: Never used  Substance and Sexual Activity  . Alcohol use: No  . Drug use: No  . Sexual activity: Not on file  Other Topics Concern  . Not on file  Social History Narrative  . Not on file   Social Determinants of Health   Financial Resource Strain: Not on file  Food Insecurity: Not on file  Transportation Needs: Not on file  Physical Activity: Not on file  Stress: Not on file  Social Connections: Not on file  Intimate Partner Violence: Not on file    Family History:    Family History  Problem Relation Age of Onset  . Hypertension Mother   . Heart disease Brother      ROS:  Please see the history of present illness.   All other ROS reviewed and negative.     Physical Exam/Data:   Vitals:   09/02/20 2130 09/03/20 0450 09/03/20 0500 09/03/20 0800  BP: 110/86 129/89  122/77  Pulse: 76 77  77  Resp: 20 16  16   Temp:  97.8 F (36.6 C)  97.7 F (36.5 C)  TempSrc:  Oral  Oral  SpO2: 99% 100%  100%  Weight:   86.2 kg   Height:        Intake/Output Summary (Last 24 hours) at 09/03/2020 0934 Last data filed at 09/03/2020 0800 Gross per 24 hour  Intake --  Output 1115 ml  Net -1115 ml   Last 3 Weights 09/03/2020 09/02/2020 10/30/2019  Weight (lbs) 190 lb 0.6 oz 190 lb 0.6 oz 185 lb 3.2 oz  Weight (kg) 86.2 kg 86.2 kg 84.006 kg     Body mass index is 29.76 kg/m.  Vital Signs. BP 122/77 (BP Location: Right Arm)   Pulse 77   Temp 97.7 F (36.5 C) (Oral)   Resp 16   Ht 5\' 7"  (1.702 m)   Wt 86.2 kg   SpO2 100%   BMI 29.76 kg/m  General: Well developed, well nourished AAM n no acute distress. Head: Normocephalic,  atraumatic, sclera non-icteric, no xanthomas, nares are without discharge. Neck: Negative for carotid bruits. JVP not elevated. Lungs: Clear bilaterally to auscultation without wheezes, rales, or rhonchi. Breathing is unlabored. Heart: RRR S1 S2 without murmurs, rubs, or gallops.  Abdomen: Soft, non-tender, non-distended with normoactive bowel sounds. No rebound/guarding. Extremities: No clubbing or cyanosis. No edema. Distal pedal pulses are 2+ and equal bilaterally. Neuro: Alert and oriented X 3. Moves all extremities spontaneously. Psych:  Responds to questions appropriately with a normal affect.   EKG:  The EKG was personally reviewed and demonstrates: NSR 77bpm, 1st degree AVB, RBBB QRS duration 11/01/2019, QTC in context of wide QRS   Telemetry:  Telemetry was personally reviewed and demonstrates: NSR, rare PVCs/few couplets  Relevant CV Studies: NST 04/2020  FINDINGS::   This is a technically adequate study.   The stress gated images revealed left ventricular ejection fraction of 12% with severe global hypokinesis.   There is a large severe fixed defect in the inferior wall as well as the distal anterior apical wall. No evidence of inducible ischemia by perfusion imaging.   CONCLUSION:   No evidence of inducible ischemia.  Severely reduced left ventricular function.    2D echo 04/2020 Care Everywhere LeftVentricle: Systolic function is severely abnormal. EF: 15-20%. ;  GLS = -5.900% from the apical 4,3,2 chamber views respectively.  . AorticValve: Mild regurgitation.  . MitralValve: There is mild regurgitation.  . TricuspidValve: There is mild regurgitation.  . PulmonicValve: Moderate (2-3+) regurgitation.   Severely decreased LV systolic function. Mild AI/MR/TR. Moderate PI. No  prior echo for comparison.  Narrative Performed by CPACS This result has an attachment that is not available.  .  Left Ventricle  Normal left ventricular size. There is  moderate concentric hypertrophy. Systolic function is severely abnormal. EF: 15-20%. ; GLS = -5.900%  from the apical 4,3,2 chamber views respectively. There is severe hypokinesis of the left ventricle. Doppler parameters consistent with moderate diastolic dysfunction and elevated LA pressure.   Right Ventricle  Right ventricle appears normal. Systolic function is normal.   Left Atrium  Left atrium is mildly dilated.   Right Atrium  Right atrium is mildly dilated.   IVC/SVC  The inferior vena cava demonstrates a diameter of <=2.1 cm and collapses >50%; therefore, the right atrial pressure is estimated at 3 mmHg.   Mitral Valve  The leaflets are moderately thickened. There is mild regurgitation.   Tricuspid Valve  The leaflets are mildly thickened. There is mild regurgitation. Unable to assess RVSP due to incomplete Doppler signal.   Aortic Valve  The aortic valve is tricuspid. The leaflets are moderately thickened and exhibit normal excursion. There is mild sclerosis. Mild regurgitation. There is no evidence of aortic valve stenosis.   Pulmonic Valve  Pulmonic valve is mildly thickened. Moderate (2-3+) regurgitation.   Ascending Aorta  The aortic root and ascending aorta are borderlinedilated (3.7 cm).   Pericardium  There is no pericardial effusion.   Study Details  A complete echo was performed using complete 2D, color flow Doppler, spectral Doppler and strain.  Cath 2018 Procedure  Procedure Type  Diagnostic procedure: Left Heart Cath , Selective Coronaries, Left  Ventricular Pressures Only  Complications: No Complications.  Conclusions  Diagnostic Procedure Summary  Severe LV dysfunction, global pattern  nonobstructive CAD RCA and LAD  focal stenosis of OM1  I have reviewed the recent history and physical documentation. I personally  spent 15 minutes continuously monitoring the patient during the administration  of moderate sedation. Pre and post activities have  been reviewed. I was  present for the entire procedure.  Diagnostic Procedure Recommendations  medical therapy for LVD and CAD; PCI of OM1 unlikely to improve LVD or  symptoms  EP evaluation  Signatures  Electronically signed by Eliot Ford, MD, FACC(Diagnostic  Physician) on 07/22/2016 14:28  Angiographic findings  Cardiac Arteries and Lesion Findings  LMCA: 0% and Normal.  LAD: Abnormal.  LCx: Multiple stenosis.  Lesion on 1st Ob Marg: Mid subsection.75% stenosis 8 mm length . Pre  procedure TIMI III flow was noted. Good run off was present. The lesion was  diagnosed as Low Risk (A).  Lesion on Lat 2nd Ob Marg: Ostial.50% stenosis 5 mm length . Pre procedure  TIMI III flow was noted. Good run off was present.  RCA: Multiple stenosis.  Lesion on Prox RCA: Mid subsection.35% stenosis 15 mm length . Pre  procedure TIMI III flow was noted. Good run off was present.  Lesion on Dist RCA: Proximal subsection.35% stenosis 20 mm length . Pre  procedure TIMI III flow was noted. Good run off was present.  Procedure Data  Procedure Date  Date: 07/22/2016 Start: 13:48  Diagnostic Catheters  - 67F JR 4.0 IMPULSE Diagnostic Catheter was used for Right coronary  angiography.  - 67F JL 4.0 IMPULSE Diagnostic Catheter was used for Left coronary  angiography.  - 67F PIG IMPULSE Diagnostic Catheter was used for Left ventriculography.  Contrast Material  - Omnipaque43 ml  Fluoroscopy Time: Diagnostic: 2:00 minutes. Total: 2:00 minutes.     Laboratory Data:  High Sensitivity Troponin:   Recent Labs  Lab 09/02/20 1930 09/02/20 2231  TROPONINIHS 33* 31*     Chemistry Recent Labs  Lab 09/02/20 1925 09/03/20 0533  NA 133* 136  K 4.6 4.0  CL 99  100  CO2 22 23  GLUCOSE 122* 100*  BUN 43* 44*  CREATININE 2.29* 2.03*  CALCIUM 9.4 9.7  GFRNONAA 30* 35*  ANIONGAP 12 13    Recent Labs  Lab 09/02/20 1925  PROT 7.4  ALBUMIN 4.1  AST 43*   ALT 43  ALKPHOS 51  BILITOT 2.0*   Hematology Recent Labs  Lab 09/02/20 1925  WBC 5.0  RBC 5.15  HGB 15.9  HCT 46.4  MCV 90.1  MCH 30.9  MCHC 34.3  RDW 17.2*  PLT 129*   BNP Recent Labs  Lab 09/02/20 1925  BNP 3,492.7*    DDimer No results for input(s): DDIMER in the last 168 hours.   Radiology/Studies:  DG Chest 2 View  Result Date: 09/02/2020 CLINICAL DATA:  Shortness of breath EXAM: CHEST - 2 VIEW COMPARISON:  10/30/2019 FINDINGS: Cardiomegaly. No focal opacity. Tiny right CP angle pleural effusion or thickening. Aortic atherosclerosis. No pneumothorax. IMPRESSION: Cardiomegaly with tiny right pleural effusion or thickening. Electronically Signed   By: Jasmine Pang M.D.   On: 09/02/2020 20:07     Assessment and Plan:   1. Acute on chronic systolic CHF - baseline weight not totally clear, patient's home weights discordant with [facility] weights  - asked nursing team to clarify weight today since it was same value as yesterday - he was reweighed this AM at 182.4lb - with 2 doses of IV Lasix patient feels symptomatically improved - remains on carvedilol; home losartan, spironolactone and KCl are held for now given AKI - patient states Marcelline Deist was trialed but they decided to stop, somewhat vague on details - will discuss further management with MD - Sherryll Burger may be worthwhile trying but patient is concerned about cost therefore may be best to initiate through primary cardiologist  - Reviewed sodium restriction with pt, will rx CHF book  2. AKI superimposed on CKD stage II - last comparative value in 2020 was 1.35 so value in 2021 is not known. He was admitted at 2.29, improving to 2.03 with diuresis - will review meds with MD  3. CAD - hs Troponins low/flat consistent with CHF not ACS - 33->31 - SOB resolved with diuresis - consider ASA, BB. Needs OP f/u of platelet count - [facility] records stated patient was on Zetia - patient states this was something they were  trying out and ultimately discontinued. Recommend to follow-up with primary cardiologist to discuss lipid plan (consider statin initiation in follow-up unless not on this for other reasons)  4. Prolonged QT interval - in context of wide RBBB - lytes OK - will review with MD  5. Thrombocytopenia - needs OP f/u of this  Risk Assessment/Risk Scores:       New York Heart Association (NYHA) Functional Class NYHA Class II   For questions or updates, please contact [facility] Please consult [email or web address] for contact info under    Signed, Laurann Montana, PA-C  09/03/2020 9:34 AM

## 2020-09-03 NOTE — TOC Transition Note (Signed)
Transition of Care [facility]) - CM/SW Discharge Note   Patient Details  Name: Brandon Cuevas MRN: [id number] Date of Birth: [birth year]  [facility] CM/SW Contact:  Lanier Clam, RN Phone Number: 09/03/2020, 11:58 AM   Clinical Narrative: Referral for chf protocal-1 adm/40months, & 0/ed visiits-Not appropriate for chf protocal. Spoke to spouse-d/c plan home. No CM needs.      Final next level of care: Home/Self Care Barriers to Discharge: No Barriers Identified   Patient Goals and CMS Choice Patient states their goals for this hospitalization and ongoing recovery are:: go home CMS Medicare.gov Compare Post Acute Care list provided to:: Patient Represenative (must comment) [name] spouse)    Discharge Placement                       Discharge Plan and Services   Discharge Planning Services: CM Consult                                 Social Determinants of Health (SDOH) Interventions     Readmission Risk Interventions No flowsheet data found.

## 2020-10-11 DEATH — deceased
